# Patient Record
Sex: Female | Born: 1980 | Race: Black or African American | Hispanic: No | Marital: Married | Smoking: Never smoker
Health system: Southern US, Community
[De-identification: ages and names within clinical notes are randomized; demographics above are authoritative.]

## PROBLEM LIST (undated history)

## (undated) DIAGNOSIS — R7303 Prediabetes: Secondary | ICD-10-CM

## (undated) DIAGNOSIS — D250 Submucous leiomyoma of uterus: Secondary | ICD-10-CM

## (undated) DIAGNOSIS — Z87442 Personal history of urinary calculi: Secondary | ICD-10-CM

---

## 2014-05-31 ENCOUNTER — Emergency Department: Payer: Self-pay | Admitting: Emergency Medicine

## 2014-05-31 LAB — URINALYSIS, COMPLETE
BILIRUBIN, UR: NEGATIVE
Bacteria: NONE SEEN
GLUCOSE, UR: NEGATIVE mg/dL (ref 0–75)
Ketone: NEGATIVE
Nitrite: NEGATIVE
PH: 5 (ref 4.5–8.0)
RBC,UR: 10 /HPF (ref 0–5)
SPECIFIC GRAVITY: 1.03 (ref 1.003–1.030)
Squamous Epithelial: 7

## 2014-05-31 LAB — BASIC METABOLIC PANEL
ANION GAP: 7 (ref 7–16)
BUN: 10 mg/dL (ref 7–18)
CREATININE: 1.1 mg/dL (ref 0.60–1.30)
Calcium, Total: 8.2 mg/dL — ABNORMAL LOW (ref 8.5–10.1)
Chloride: 108 mmol/L — ABNORMAL HIGH (ref 98–107)
Co2: 24 mmol/L (ref 21–32)
EGFR (African American): >60
EGFR (Non-African Amer.): >60
Glucose: 94 mg/dL (ref 65–99)
Osmolality: 276 (ref 275–301)
Potassium: 3.5 mmol/L (ref 3.5–5.1)
SODIUM: 139 mmol/L (ref 136–145)

## 2014-05-31 LAB — CBC
HCT: 42 % (ref 35.0–47.0)
HGB: 13.6 g/dL (ref 12.0–16.0)
MCH: 29 pg (ref 26.0–34.0)
MCHC: 32.5 g/dL (ref 32.0–36.0)
MCV: 89 fL (ref 80–100)
PLATELETS: 232 10*3/uL (ref 150–440)
RBC: 4.71 10*6/uL (ref 3.80–5.20)
RDW: 13.6 % (ref 11.5–14.5)
WBC: 8.3 10*3/uL (ref 3.6–11.0)

## 2014-05-31 LAB — TROPONIN I: Troponin-I: <0.02 ng/mL

## 2014-06-03 LAB — URINE CULTURE

## 2014-06-18 ENCOUNTER — Ambulatory Visit: Payer: Self-pay | Admitting: Urology

## 2014-07-04 ENCOUNTER — Ambulatory Visit: Payer: Self-pay | Admitting: Urology

## 2014-07-04 HISTORY — PX: URETEROSCOPY: SHX842

## 2014-12-22 NOTE — Op Note (Signed)
PATIENT NAMEMALENE, Jill Mcmillan MR#:  [id number] DATE OF BIRTH:  August 06, 1981  DATE OF PROCEDURE:  07/04/2014  PREOPERATIVE DIAGNOSIS: Left distal ureteral stone.   POSTOPERATIVE DIAGNOSIS: No stone identified.   PROCEDURE PERFORMED: Left ureteroscopy, left retrograde pyelogram, left ureteral stent placement on string.   ATTENDING SURGEON: Sherlynn Stalls, MD.   ANESTHESIA: General anesthesia.   ESTIMATED BLOOD LOSS: Minimal.   DRAINS: A 6 x 24 French double-J ureteral stent on the left.   COMPLICATIONS: None.   SPECIMENS: None.   INDICATION: This is a 34 year old female with a very small left distal ureteral stone which has been present for many months. She has continued to have left lower quadrant pain. Followup renal ultrasound 2 weeks ago did show some fullness in the left collecting system consistent with retained stone. She was counseled on her various treatment options and has elected to undergo left ureteroscopy today for possible intervention for this stone. Of note in the preoperative holding area she did report that over the past week she has not had any further pain. She was offered to repeat imaging to assess whether the stone has passed versus proceed to the OR for a diagnostic ureteroscopy and elects to go to the operating room today. Risks and benefits of the surgery were explained in detail. The patient agreed to proceed as planned.   PROCEDURE: The patient was correctly identified in the preoperative holding area and informed consent was obtained. She was brought to the operating suite and placed on the table in the supine position. At this time universal timeout protocol was performed. All team members were identified. Venodyne boots were placed and she was administered IV antibiotics in the perioperative period. She was then placed under general anesthesia, repositioned in the bed in the dorsal lithotomy position, and prepped and draped in standard surgical fashion. A rigid  ureteroscope was advanced per urethra into the bladder and the bladder was surveyed. Attention was turned to the left ureteral orifice which was easily cannulated using a 5 [place] open-ended ureteral catheter just within the UO. A retrograde pyelogram was then performed, which revealed a delicate-appearing ureter without evidence of filling defects or hydronephrosis. Her renal pelvis was slightly full, however this was not felt to be related to an obstruction. I did go ahead and attempt left ureteroscopy within the distal ureter to insure that there was indeed no stone fragment. I had some slight difficulty entering the left ureteral orifice and a second wire was introduced up to the level of the proximal ureter and the 7 French short rigid ureteroscope was able to advance within the distal ureter without difficulty. I was able to advance the scope up to the mid distal ureter, however there was some concentric narrowing, I was unable to pass easily beyond this area. In order to avoid any injury to the ureter with a very high suspicion that the stone had already passed, the decision was made not to proceed any further. The safety wire was back-loaded over a rigid cystoscope and a 6 x 24 French double-J ureteral stent was placed over the wire up to the level of the renal pelvis. The wire was partially withdrawn until a coil was noted within the renal pelvis. The wire was then fully withdrawn and a coil was noted within the bladder. The string was left on the stent with plans for the patient to remove the stent herself on Monday, 5 days from today. The patient was reversed from anesthesia and taken  to the PACU in stable condition after the string was secured to the patient's left inner thigh using Mastisol and a Tegaderm. There were no complications in this case.   PLAN: The patient will self remove the stent on Monday as there is likely no residual stone and if there was indeed a 2 mm fragment then this would likely  come out with stent removal. She will follow up in the office in 4 weeks for renal ultrasound to confirm resolution of her fullness.     ____________________________ Sherlynn Stalls, MD ajb:[id number] D: 07/04/2014 19:47:20 ET T: 07/04/2014 20:14:40 ET JOB#: [id number]  cc: Sherlynn Stalls, MD, <Dictator> Sherlynn Stalls MD ELECTRONICALLY SIGNED 07/19/2014 15:19

## 2016-06-08 IMAGING — US US RENAL KIDNEY
1 series · 14 of 25 positions shown · non-contrast
Comparison: No priors

CLINICAL DATA: Flank pain with history of kidney stones.

EXAM:
RENAL/URINARY TRACT ULTRASOUND COMPLETE

[Series 1: us renal kidney · 0.26mm/px · 14 of 52 slices shown]
[im 1/52]
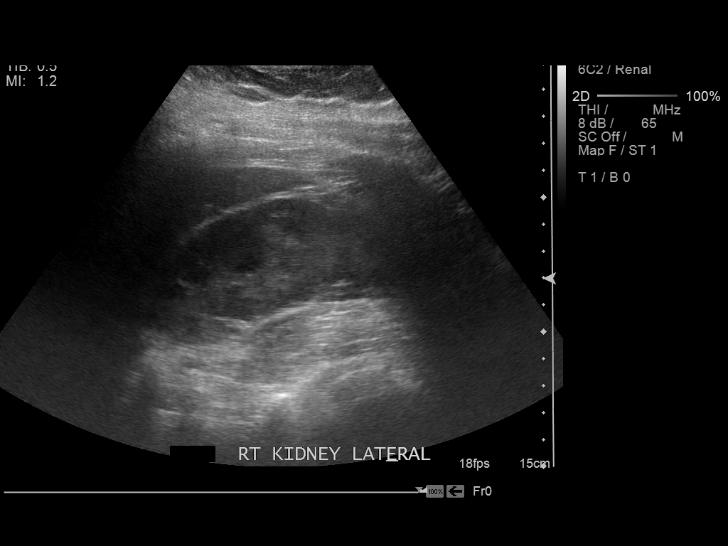
[im 5/52]
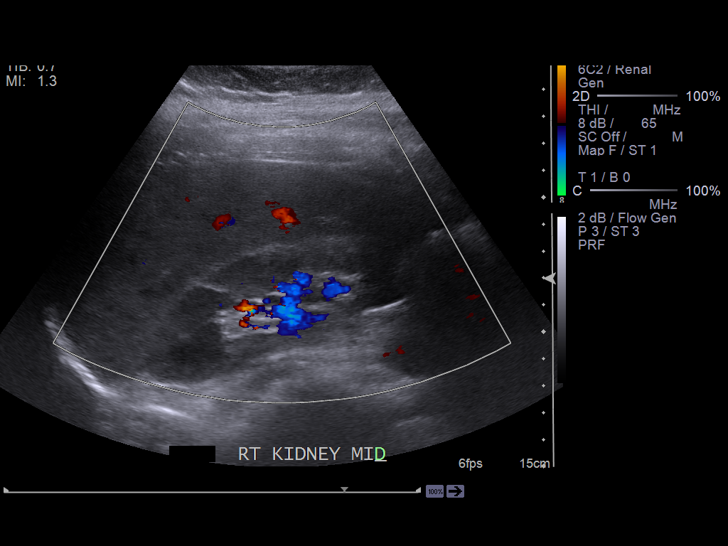
[im 9/52]
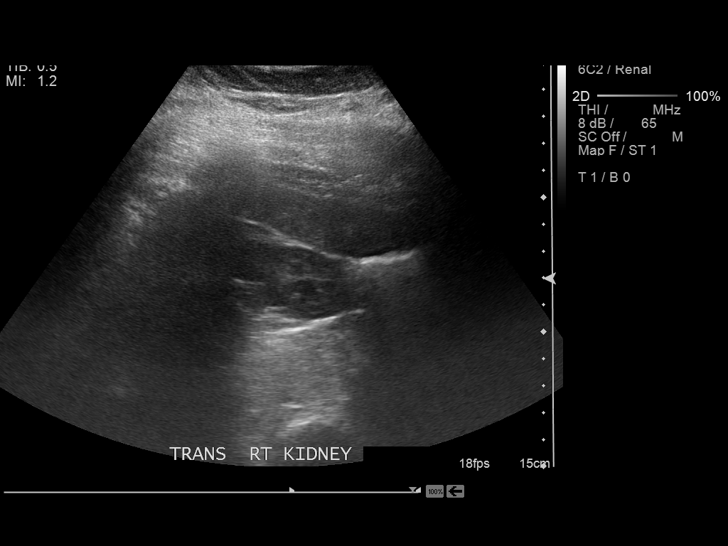
[im 13/52]
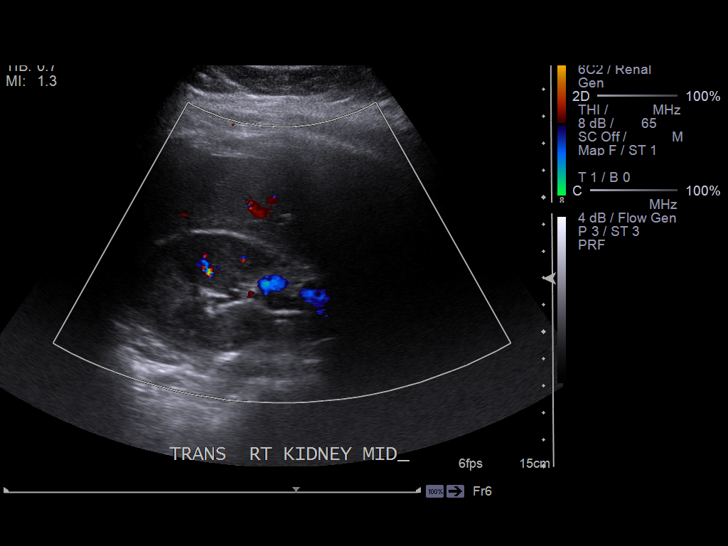
[im 18/52]
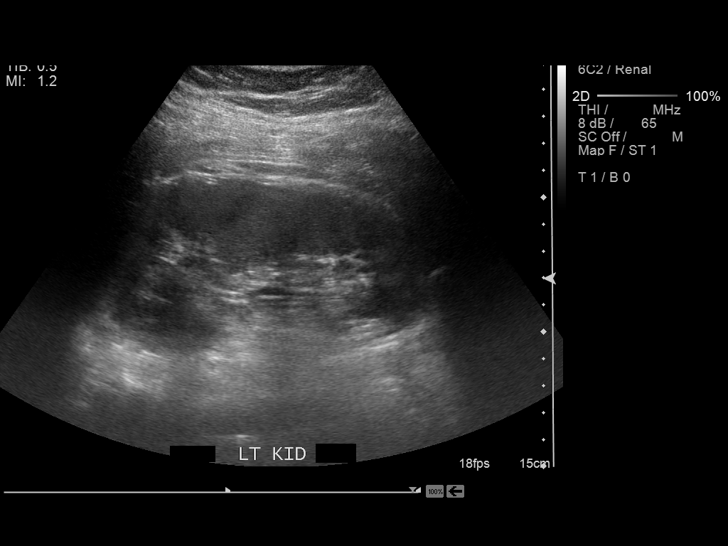
[im 20/52]
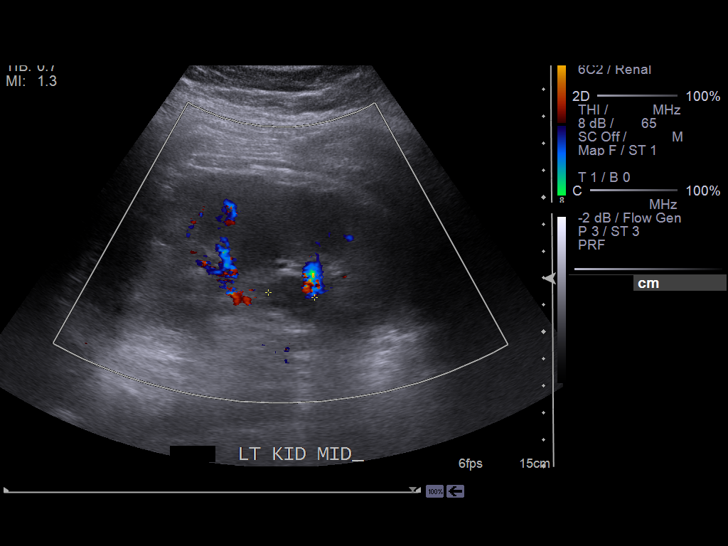
[im 24/52]
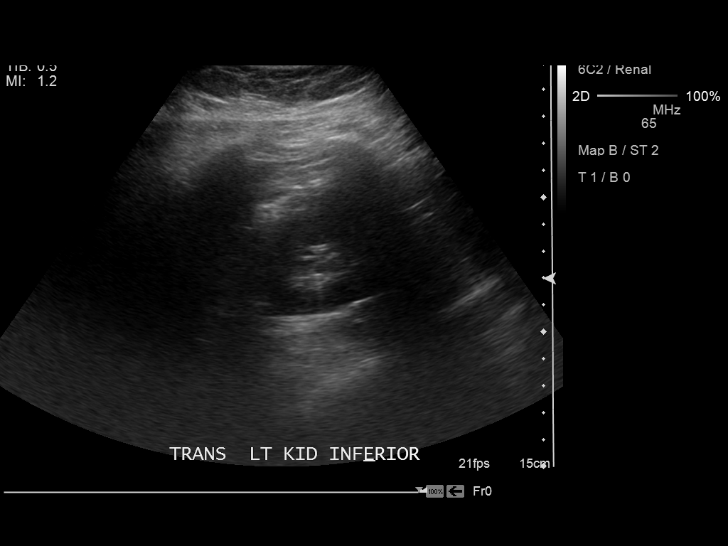
[im 28/52]
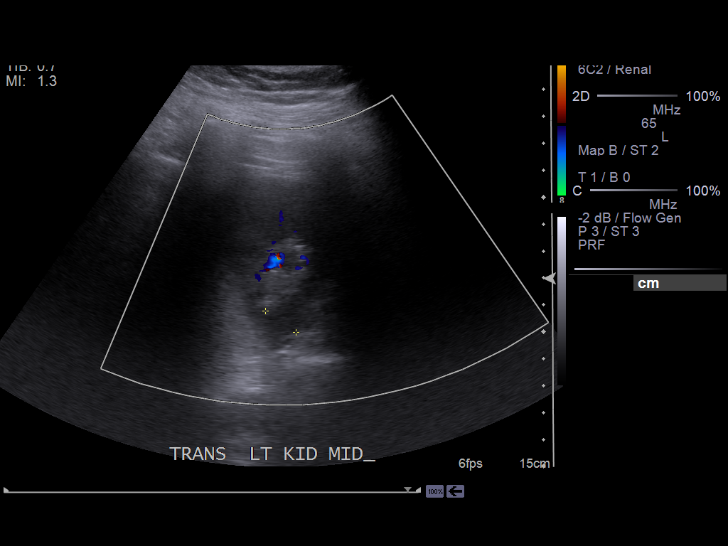
[im 32/52]
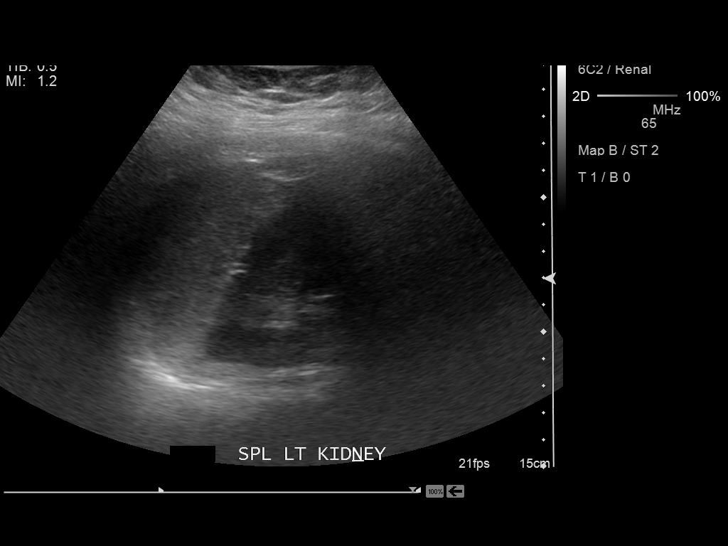
[im 35/52]
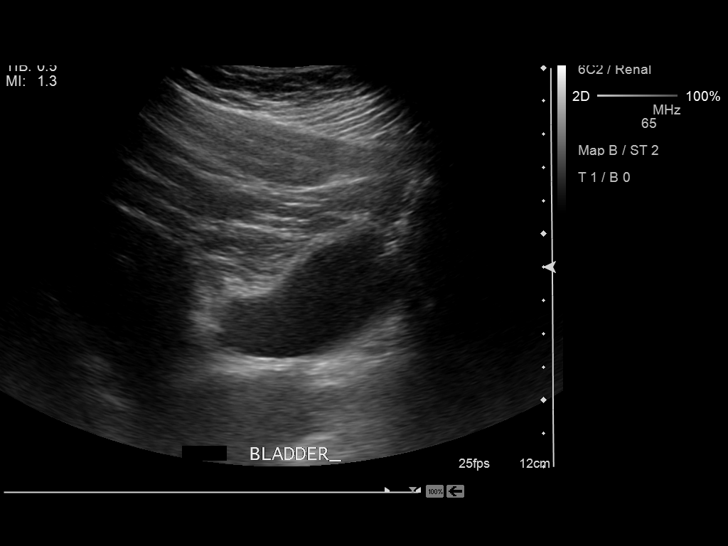
[im 39/52]
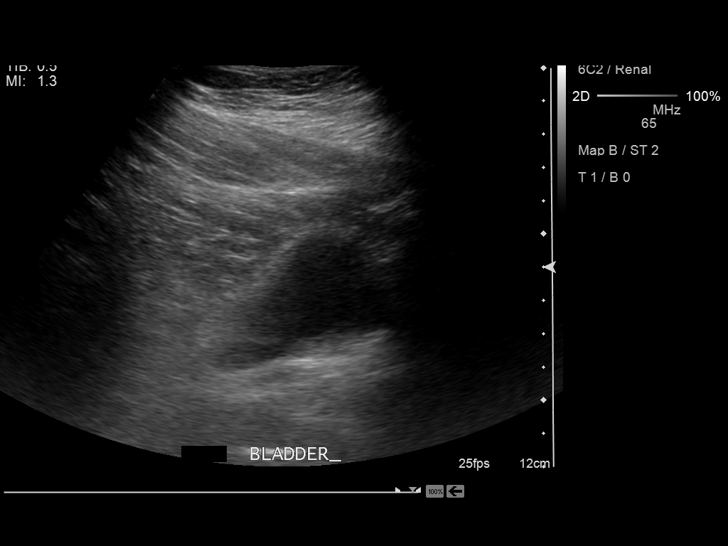
[im 43/52]
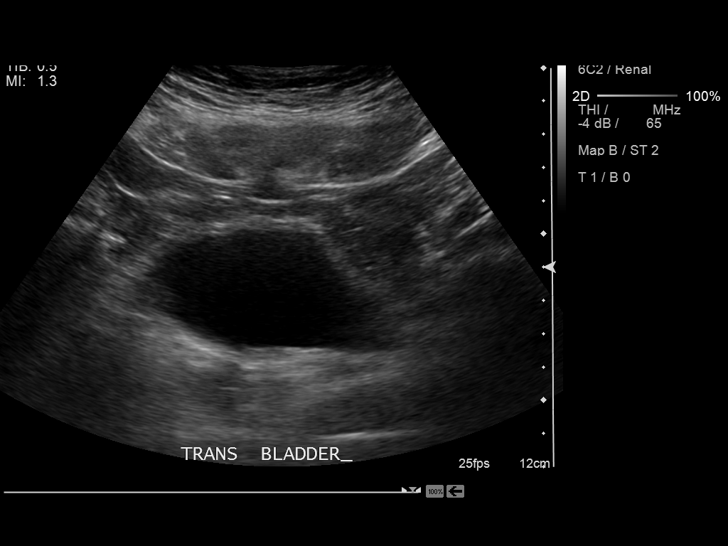
[im 47/52]
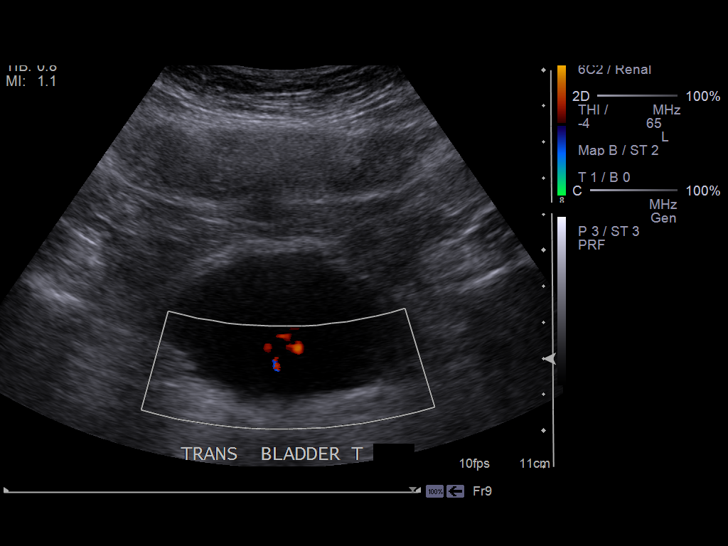
[im 52/52]
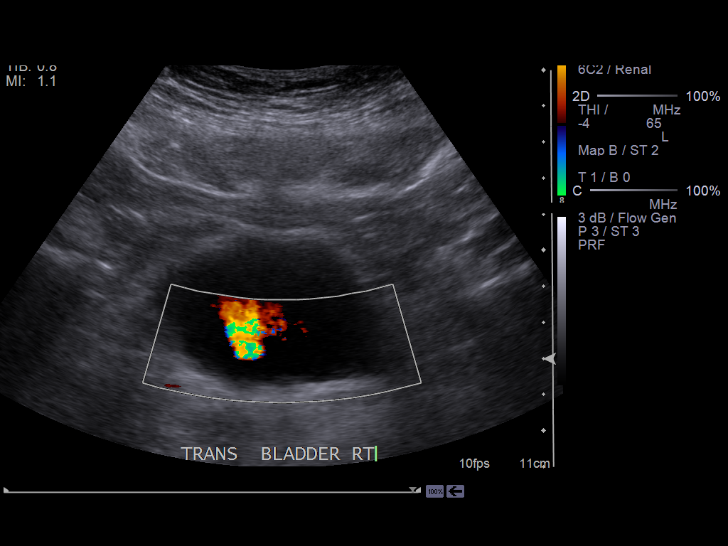

[14 of 25 positions shown; findings below may reference images not displayed]

FINDINGS: Right Kidney:

Length: 10.9 cm. Echogenicity within normal limits. No mass or
hydronephrosis visualized. No stones visualized by ultrasound.

Left Kidney:

Length: 12.1 cm. Echogenicity within normal limits. There is slight
asymmetric fullness of the left renal pelvis compared to the right
renal pelvis. No definite calyceal dilatation is seen in the left
kidney. No stones visualized by ultrasound.

Bladder:

Appears normal for degree of bladder distention.
IMPRESSION: 1. Mild asymmetric fullness of the left renal collecting system
without definitive hydronephrosis.
2. Normal size kidneys bilaterally.
3. No renal stones are visualized. Please note that CT is more
sensitive for detecting urinary tract stones than ultrasound.

## 2020-02-13 ENCOUNTER — Ambulatory Visit: Payer: Self-pay | Attending: Internal Medicine

## 2021-08-20 ENCOUNTER — Other Ambulatory Visit: Payer: Self-pay

## 2021-08-20 ENCOUNTER — Encounter: Payer: Self-pay | Admitting: Obstetrics and Gynecology

## 2021-08-20 ENCOUNTER — Ambulatory Visit: Payer: Managed Care, Other (non HMO) | Admitting: Obstetrics and Gynecology

## 2021-08-20 ENCOUNTER — Other Ambulatory Visit (HOSPITAL_COMMUNITY)
Admission: RE | Admit: 2021-08-20 | Discharge: 2021-08-20 | Disposition: A | Discharge status: Home / Self Care | Payer: Managed Care, Other (non HMO) | Source: Ambulatory Visit | Attending: Obstetrics and Gynecology | Admitting: Obstetrics and Gynecology

## 2021-08-20 VITALS — BP 119/82 | HR 78 | Ht 69.0 in | Wt 267.3 lb

## 2021-08-20 DIAGNOSIS — Z01419 Encounter for gynecological examination (general) (routine) without abnormal findings: Secondary | ICD-10-CM | POA: Insufficient documentation

## 2021-08-20 DIAGNOSIS — N921 Excessive and frequent menstruation with irregular cycle: Secondary | ICD-10-CM | POA: Diagnosis not present

## 2021-08-20 DIAGNOSIS — Z975 Presence of (intrauterine) contraceptive device: Secondary | ICD-10-CM

## 2021-08-20 NOTE — Progress Notes (Signed)
Patient presents for annual exam. Patient states experiencing heavy period that has been going on for 6 months after years of no period while having an IUD. Patient reports the bleeding has gone from a heavy flow to moderate flow for the past 3 months. Patient states a lump in her vagina for 4-5 days causing dryness, patient states bright red blood upon wiping. Patient states lump has reduced in size and no longer hurting.

## 2021-08-20 NOTE — Progress Notes (Signed)
HPI:      [name] is a 40 y.o. G2P1011 who LMP was Patient's last menstrual period was 07/30/2021 (exact date).  Subjective:   She presents today for her annual examination.  She states that approximately 6 months ago she began having menstrual bleeding with her IUD.  Prior to that she had been amenorrheic.  She says her periods are heavy with cramping each month.  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 Secondarily she reports that she developed a small cystic structure on her left labia which was moderately painful but which has now seem to resolve. She is not sure of her last Pap smear and believes she is due for one. She states she is up-to-date on mammograms and these occur for her in March.    Hx: The following portions of the patient's history were reviewed and updated as appropriate:             She  has no past medical history on file. She does not have a problem list on file. She  has no past surgical history on file. Her family history includes Alcohol abuse in her father; Breast cancer in her mother; COPD in her father; Hypertension in her father and mother. She  reports that she has never smoked. She has never used smokeless tobacco. She reports current alcohol use of about 2.0 standard drinks per week. She reports that she does not currently use drugs. She currently has no medications in their medication list. She is allergic to penicillins.       Review of Systems:  Review of Systems  Constitutional: Denied constitutional symptoms, night sweats, recent illness, fatigue, fever, insomnia and weight loss.  Eyes: Denied eye symptoms, eye pain, photophobia, vision change and visual disturbance.  Ears/Nose/Throat/Neck: Denied ear, nose, throat or neck symptoms, hearing loss, nasal discharge, sinus congestion and sore throat.  Cardiovascular: Denied cardiovascular symptoms, arrhythmia, chest pain/pressure, edema, exercise intolerance,  orthopnea and palpitations.  Respiratory: Denied pulmonary symptoms, asthma, pleuritic pain, productive sputum, cough, dyspnea and wheezing.  Gastrointestinal: Denied, gastro-esophageal reflux, melena, nausea and vomiting.  Genitourinary: See HPI for additional information.  Musculoskeletal: Denied musculoskeletal symptoms, stiffness, swelling, muscle weakness and myalgia.  Dermatologic: Denied dermatology symptoms, rash and scar.  Neurologic: Denied neurology symptoms, dizziness, headache, neck pain and syncope.  Psychiatric: Denied psychiatric symptoms, anxiety and depression.  Endocrine: Denied endocrine symptoms including hot flashes and night sweats.   Meds:   No current outpatient medications on file prior to visit.   No current facility-administered medications on file prior to visit.    Objective:     Vitals:   08/20/21 1441  BP: 119/82  Pulse: 78    Filed Weights   08/20/21 1441  Weight: 267 lb 4.8 oz (121.2 kg)              Physical examination General NAD, Conversant  HEENT Atraumatic; Op clear with mmm.  Normo-cephalic. Pupils reactive. Anicteric sclerae  Thyroid/Neck Smooth without nodularity or enlargement. Normal ROM.  Neck Supple.  Skin No rashes, lesions or ulceration. Normal palpated skin turgor. No nodularity.  Breasts: No masses or discharge.  Symmetric.  No axillary adenopathy.  Lungs: Clear to auscultation.No rales or wheezes. Normal Respiratory effort, no retractions.  Heart: NSR.  No murmurs or rubs appreciated. No periferal edema  Abdomen: Soft.  Non-tender.  No masses.  No HSM. No hernia  Extremities: Moves all appropriately.  Normal ROM for age.  No lymphadenopathy.  Neuro: Oriented to PPT.  Normal mood. Normal affect.     Pelvic:   Vulva: Normal appearance.  No lesions.  Vagina: No lesions or abnormalities noted.  Support: Normal pelvic support.  Urethra No masses tenderness or scarring.  Meatus Normal size without lesions or prolapse.   Cervix: Normal appearance.  No lesions.  IUD strings noted.  Longer than expected.  Anus: Normal exam.  No lesions.  Perineum: Normal exam.  No lesions.        Bimanual   Uterus: Normal size.  Non-tender.  Mobile.  AV.  Adnexae: No masses.  Non-tender to palpation.  Cul-de-sac: Negative for abnormality.     Assessment:    G2P1011 There are no problems to display for this patient.    1. Well woman exam with routine gynecological exam   2. Breakthrough bleeding associated with intrauterine device (IUD)     Normal exam.  Possibly malpositioned IUD based on cramping and bleeding starting suddenly and continuing.   Plan:            1.  Basic Screening Recommendations The basic screening recommendations for asymptomatic women were discussed with the patient during her visit.  The age-appropriate recommendations were discussed with her and the rational for the tests reviewed.  When I am informed by the patient that another primary care physician has previously obtained the age-appropriate tests and they are up-to-date, only outstanding tests are ordered and referrals given as necessary.  Abnormal results of tests will be discussed with her when all of her results are completed.  Routine preventative health maintenance measures emphasized: Exercise/Diet/Weight control, Tobacco Warnings, Alcohol/Substance use risks and Stress Management Pap performed mammogram ordered 2.  Ultrasound ordered for IUD position Orders No orders of the defined types were placed in this encounter.   No orders of the defined types were placed in this encounter.         F/U  No follow-ups on file.  Finis Bud, M.D. 08/20/2021 3:02 PM

## 2021-08-27 LAB — CYTOLOGY - PAP
Comment: NEGATIVE
Diagnosis: NEGATIVE
High risk HPV: NEGATIVE

## 2021-09-10 ENCOUNTER — Other Ambulatory Visit: Payer: Managed Care, Other (non HMO)

## 2021-09-17 ENCOUNTER — Other Ambulatory Visit: Payer: Self-pay | Admitting: Obstetrics and Gynecology

## 2021-09-17 ENCOUNTER — Ambulatory Visit (INDEPENDENT_AMBULATORY_CARE_PROVIDER_SITE_OTHER): Payer: Managed Care, Other (non HMO)

## 2021-09-17 ENCOUNTER — Other Ambulatory Visit: Payer: Self-pay

## 2021-09-17 DIAGNOSIS — N921 Excessive and frequent menstruation with irregular cycle: Secondary | ICD-10-CM

## 2021-09-17 DIAGNOSIS — Z975 Presence of (intrauterine) contraceptive device: Secondary | ICD-10-CM

## 2021-09-27 ENCOUNTER — Encounter: Payer: Self-pay | Admitting: Obstetrics and Gynecology

## 2021-10-01 NOTE — Progress Notes (Signed)
Jill Mcmillan: Please have her schedule a video visit with me to discuss her most recent ultrasound findings and to review her most recent history of bleeding.

## 2021-10-07 ENCOUNTER — Encounter: Payer: Self-pay | Admitting: Obstetrics and Gynecology

## 2021-10-07 ENCOUNTER — Telehealth: Payer: Managed Care, Other (non HMO) | Admitting: Obstetrics and Gynecology

## 2021-10-07 DIAGNOSIS — Z975 Presence of (intrauterine) contraceptive device: Secondary | ICD-10-CM | POA: Diagnosis not present

## 2021-10-07 DIAGNOSIS — D250 Submucous leiomyoma of uterus: Secondary | ICD-10-CM | POA: Diagnosis not present

## 2021-10-07 DIAGNOSIS — N921 Excessive and frequent menstruation with irregular cycle: Secondary | ICD-10-CM | POA: Diagnosis not present

## 2021-10-07 NOTE — Progress Notes (Signed)
Virtual Visit via Video Note  I connected with Jill Mcmillan on 10/07/21 at  7:45 AM EST by video and verified that I was speaking with the correct person using two identifiers.    [name] is a 41 y.o. G2P1011 who LMP was No LMP recorded. I discussed the limitations, risks, security and privacy concerns of performing an evaluation and management service by video and the availability of in person appointments. I also discussed with the patient that there may be a patient responsible charge related to this service. The patient expressed understanding and agreed to proceed.  Location of patient:  Home  Patient gave explicit verbal consent for video visit:  YES  Location of provider:  [facility] office  Persons other than physician and patient involved in provider conference:  None   Subjective:   History of Present Illness:    Irregular bleeding associated with IUD.  Patient continues to have bleeding approximately every 2 weeks and is not just spotting.  She is understandably frustrated with the bleeding. She recently underwent ultrasound for positioning of the IUD or to determine any other causes of bleeding.  Hx: The following portions of the patient's history were reviewed and updated as appropriate:             She  has no past medical history on file. She does not have a problem list on file. She  has no past surgical history on file. Her family history includes Alcohol abuse in her father; Breast cancer in her mother; COPD in her father; Hypertension in her father and mother. She  reports that she has never smoked. She has never used smokeless tobacco. She reports current alcohol use of about 2.0 standard drinks per week. She reports that she does not currently use drugs. She currently has no medications in their medication list. She is allergic to penicillins.       Review of Systems:  Review of Systems  Constitutional: Denied constitutional symptoms, night sweats, recent  illness, fatigue, fever, insomnia and weight loss.  Eyes: Denied eye symptoms, eye pain, photophobia, vision change and visual disturbance.  Ears/Nose/Throat/Neck: Denied ear, nose, throat or neck symptoms, hearing loss, nasal discharge, sinus congestion and sore throat.  Cardiovascular: Denied cardiovascular symptoms, arrhythmia, chest pain/pressure, edema, exercise intolerance, orthopnea and palpitations.  Respiratory: Denied pulmonary symptoms, asthma, pleuritic pain, productive sputum, cough, dyspnea and wheezing.  Gastrointestinal: Denied, gastro-esophageal reflux, melena, nausea and vomiting.  Genitourinary: See HPI for additional information.  Musculoskeletal: Denied musculoskeletal symptoms, stiffness, swelling, muscle weakness and myalgia.  Dermatologic: Denied dermatology symptoms, rash and scar.  Neurologic: Denied neurology symptoms, dizziness, headache, neck pain and syncope.  Psychiatric: Denied psychiatric symptoms, anxiety and depression.  Endocrine: Denied endocrine symptoms including hot flashes and night sweats.   Meds:   No current outpatient medications on file prior to visit.   No current facility-administered medications on file prior to visit.    Ultrasound:  Findings:  The uterus is anteverted and measures 9.6 x 6.2 x 5.6 cm. Echo texture is heterogenous with evidence of focal masses. Within the uterus are multiple suspected fibroids measuring: Fibroid 1:  3.3 x 3.1 x 4.0 cm subserosal  Fibroid 2:   1.6 x 1.8 x 1.9 cm intramural Fibroid 3:   1.5 x 1.3 x 1.7 cm Fundal subserosal Fibroid 4:    1.4 x .9 x 1.0 cm Submucosal   The Endometrium measures 13.9 mm.   Right Ovary measures 1.8 x 4.0 x 3.2 cm.  It is normal in appearance; seen transabdominally only.   Left Ovary is not visualized.   Survey of the adnexa demonstrates no adnexal masses. There is no free fluid in the cul de sac.   Impression: 1. Fibroid Uterus. 2. IUD in normal location; submucosal  fibroid may be impinging.  Assessment:    G2P1011 There are no problems to display for this patient.    1. Fibroids, submucosal   2. Breakthrough bleeding associated with intrauterine device (IUD)     Fibroids especially the submucosal fibroid is likely causing her irregular bleeding.  This in association with the IUD which may not be helping but actually increasing the bleeding as it is located immediately adjacent to the fibroid.  Plan:            1.  We have discussed multiple management schemes for uterine fibroids and irregular bleeding.  Her bleeding is not tolerable and thus a solution is sought.  She will need her IUD removed and then her options include hormonal control, endometrial ablation, uterine fibroid embolization, ultrasound-guided focused fibroid treatment.  Finally, we have also discussed the option of hysterectomy for definitive management. Fibroids in general have been discussed including subserosal submucosal and intramural.  We discussed the natural course and history of fibroids including their usual resolution of symptoms at menopause.  All questions were answered. She will inform us when she has reached a decision and we will head in that direction. Orders No orders of the defined types were placed in this encounter.   No orders of the defined types were placed in this encounter.     F/U  No follow-ups on file. I spent 22 minutes involved in the care of this patient preparing to see the patient by obtaining and reviewing her medical history (including labs, imaging tests and prior procedures), documenting clinical information in the electronic health record (EHR), counseling and coordinating care plans, writing and sending prescriptions, ordering tests or procedures and in direct communicating with the patient and medical staff discussing pertinent items from her history and physical exam.   Finis Bud, M.D. 10/07/2021 8:06 AM

## 2021-10-23 ENCOUNTER — Ambulatory Visit (INDEPENDENT_AMBULATORY_CARE_PROVIDER_SITE_OTHER): Payer: Managed Care, Other (non HMO) | Admitting: Obstetrics and Gynecology

## 2021-10-23 ENCOUNTER — Other Ambulatory Visit: Payer: Self-pay

## 2021-10-23 ENCOUNTER — Encounter: Payer: Self-pay | Admitting: Obstetrics and Gynecology

## 2021-10-23 VITALS — BP 142/93 | HR 72 | Ht 68.0 in | Wt 269.4 lb

## 2021-10-23 DIAGNOSIS — N921 Excessive and frequent menstruation with irregular cycle: Secondary | ICD-10-CM | POA: Diagnosis not present

## 2021-10-23 DIAGNOSIS — Z01818 Encounter for other preprocedural examination: Secondary | ICD-10-CM

## 2021-10-23 DIAGNOSIS — D219 Benign neoplasm of connective and other soft tissue, unspecified: Secondary | ICD-10-CM

## 2021-10-23 MED ORDER — NORETHINDRONE ACETATE 5 MG PO TABS: 5.0000 mg | ORAL_TABLET | Freq: Every day | ORAL | 0 refills | Status: DC

## 2021-10-23 NOTE — Progress Notes (Signed)
HPI:      [name] is a 41 y.o. G2P1011 who LMP was Patient's last menstrual period was 10/17/2021 (exact date).  Subjective:   She presents today stating that she has considered her options and has come to the conclusion that she would like a hysterectomy performed.  She has experience several episodes of irregular bleeding over the last few months some of this bleeding is heavy lasting 5 days.  She does have an IUD in place and has confirmed intrauterine fibroids 1 of which is submucosal. She has chosen a date in April for her surgery.    Hx: The following portions of the patient's history were reviewed and updated as appropriate:             She  has no past medical history on file. She does not have a problem list on file. She  has no past surgical history on file. Her family history includes Alcohol abuse in her father; Breast cancer in her mother; COPD in her father; Hypertension in her father and mother. She  reports that she has never smoked. She has never used smokeless tobacco. She reports current alcohol use of about 2.0 standard drinks per week. She reports that she does not currently use drugs. She has a current medication list which includes the following prescription(s): cetirizine hcl and norethindrone. She is allergic to penicillins.       Review of Systems:  Review of Systems  Constitutional: Denied constitutional symptoms, night sweats, recent illness, fatigue, fever, insomnia and weight loss.  Eyes: Denied eye symptoms, eye pain, photophobia, vision change and visual disturbance.  Ears/Nose/Throat/Neck: Denied ear, nose, throat or neck symptoms, hearing loss, nasal discharge, sinus congestion and sore throat.  Cardiovascular: Denied cardiovascular symptoms, arrhythmia, chest pain/pressure, edema, exercise intolerance, orthopnea and palpitations.  Respiratory: Denied pulmonary symptoms, asthma, pleuritic pain, productive sputum, cough, dyspnea and wheezing.   Gastrointestinal: Denied, gastro-esophageal reflux, melena, nausea and vomiting.  Genitourinary: See HPI for additional information.  Musculoskeletal: Denied musculoskeletal symptoms, stiffness, swelling, muscle weakness and myalgia.  Dermatologic: Denied dermatology symptoms, rash and scar.  Neurologic: Denied neurology symptoms, dizziness, headache, neck pain and syncope.  Psychiatric: Denied psychiatric symptoms, anxiety and depression.  Endocrine: Denied endocrine symptoms including hot flashes and night sweats.   Meds:   Current Outpatient Medications on File Prior to Visit  Medication Sig Dispense Refill   Cetirizine HCl (ZYRTEC PO) Take by mouth.     No current facility-administered medications on file prior to visit.      Objective:     Vitals:   10/23/21 1445  BP: (!) 142/93  Pulse: 72   Filed Weights   10/23/21 1445  Weight: 269 lb 6.4 oz (122.2 kg)                        Assessment:    G2P1011 There are no problems to display for this patient.    1. Menorrhagia with irregular cycle   2. Fibroids        Plan:            1.  Patient would like to schedule preop appointment for surgery in April.  We have spent some time today discussing her fibroids bleeding and anything that she can do to try to control this bleeding prior to her surgery date.  I have explained to her that I will try progesterone daily to decrease her bleeding episodes and amount.  We will  start Aygestin as prescribed below. No orders of the defined types were placed in this encounter.    Meds ordered this encounter  Medications   norethindrone (AYGESTIN) 5 MG tablet    Sig: Take 1 tablet (5 mg total) by mouth daily. As directed    Dispense:  45 tablet    Refill:  0      F/U  Return in 1 week (on 10/30/2021). I spent 21 minutes involved in the care of this patient preparing to see the patient by obtaining and reviewing her medical history (including labs, imaging tests and  prior procedures), documenting clinical information in the electronic health record (EHR), counseling and coordinating care plans, writing and sending prescriptions, ordering tests or procedures and in direct communicating with the patient and medical staff discussing pertinent items from her history and physical exam.  Finis Bud, M.D. 10/23/2021 4:05 PM

## 2021-10-23 NOTE — Progress Notes (Signed)
Patient presents today for pre-op exam for a hysterectomy. Patient reports heavy periods have led her to this decision. She states no questions or concerns at this time.

## 2021-11-06 ENCOUNTER — Other Ambulatory Visit: Payer: Self-pay

## 2021-11-06 ENCOUNTER — Ambulatory Visit (INDEPENDENT_AMBULATORY_CARE_PROVIDER_SITE_OTHER): Payer: Managed Care, Other (non HMO) | Admitting: Obstetrics and Gynecology

## 2021-11-06 ENCOUNTER — Encounter: Payer: Self-pay | Admitting: Obstetrics and Gynecology

## 2021-11-06 VITALS — BP 145/92 | HR 75 | Ht 68.0 in | Wt 270.2 lb

## 2021-11-06 DIAGNOSIS — D250 Submucous leiomyoma of uterus: Secondary | ICD-10-CM

## 2021-11-06 DIAGNOSIS — N921 Excessive and frequent menstruation with irregular cycle: Secondary | ICD-10-CM

## 2021-11-06 DIAGNOSIS — Z01818 Encounter for other preprocedural examination: Secondary | ICD-10-CM

## 2021-11-06 DIAGNOSIS — Z975 Presence of (intrauterine) contraceptive device: Secondary | ICD-10-CM

## 2021-11-06 NOTE — Progress Notes (Signed)
Patient presents today for a pre-op exam and discussion. She states no other questions or concerns. ?

## 2021-11-07 MED ORDER — METRONIDAZOLE 500 MG PO TABS: 500.0000 mg | ORAL_TABLET | Freq: Two times a day (BID) | ORAL | 0 refills | Status: DC

## 2021-11-07 NOTE — Progress Notes (Signed)
? ?    PRE-OPERATIVE HISTORY AND PHYSICAL EXAM ? ?PCP:  Sue Lush, PA-C ?Subjective:  ? ?HPI:  Jill Mcmillan is a 41 y.o. G2P1011.  Patient's last menstrual period was 10/17/2021 (exact date).  She presents today for a pre-op discussion and PE. ? ?She has the following symptoms: Menorrhagia, uterine fibroids (submucosal) -failed IUD ? ?Review of Systems:  ? ?Constitutional: Denied constitutional symptoms, night sweats, recent illness, fatigue, fever, insomnia and weight loss.  ?Eyes: Denied eye symptoms, eye pain, photophobia, vision change and visual disturbance.  ?Ears/Nose/Throat/Neck: Denied ear, nose, throat or neck symptoms, hearing loss, nasal discharge, sinus congestion and sore throat.  ?Cardiovascular: Denied cardiovascular symptoms, arrhythmia, chest pain/pressure, edema, exercise intolerance, orthopnea and palpitations.  ?Respiratory: Denied pulmonary symptoms, asthma, pleuritic pain, productive sputum, cough, dyspnea and wheezing.  ?Gastrointestinal: Denied, gastro-esophageal reflux, melena, nausea and vomiting.  ?Genitourinary: See HPI for additional information.  ?Musculoskeletal: Denied musculoskeletal symptoms, stiffness, swelling, muscle weakness and myalgia.  ?Dermatologic: Denied dermatology symptoms, rash and scar.  ?Neurologic: Denied neurology symptoms, dizziness, headache, neck pain and syncope.  ?Psychiatric: Denied psychiatric symptoms, anxiety and depression.  ?Endocrine: Denied endocrine symptoms including hot flashes and night sweats.  ? ?OB History  ?Gravida Para Term Preterm AB Living  ?'2 1 1   1 1  '$ ?SAB IAB Ectopic Multiple Live Births  ?  1     1  ?  ?# Outcome Date GA Lbr Len/2nd Weight Sex Delivery Anes PTL Lv  ?2 Term 2003   5 lb (2.268 kg)  Vag-Spont   LIV  ?1 IAB 2001          ? ? ?History reviewed. No pertinent past medical history. ? ?History reviewed. No pertinent surgical history.   ? ?SOCIAL HISTORY: ? ?Social History  ? ?Tobacco Use  ?Smoking Status Never  ?Smokeless  Tobacco Never  ? ?Social History  ? ?Substance and Sexual Activity  ?Alcohol Use Yes  ? Alcohol/week: 2.0 standard drinks  ? Types: 2 Glasses of wine per week  ? Comment: occasionally  ? ? ?Social History  ? ?Substance and Sexual Activity  ?Drug Use Not Currently  ? ? ?Family History  ?Problem Relation Age of Onset  ? Breast cancer Mother   ? Hypertension Mother   ? Hypertension Father   ? COPD Father   ? Alcohol abuse Father   ? ? ?ALLERGIES:  Penicillins ? ?MEDS: ?  ?Current Outpatient Medications on File Prior to Visit  ?Medication Sig Dispense Refill  ? Cetirizine HCl (ZYRTEC PO) Take by mouth.    ? norethindrone (AYGESTIN) 5 MG tablet Take 1 tablet (5 mg total) by mouth daily. As directed 45 tablet 0  ? ?No current facility-administered medications on file prior to visit.  ? ? ?No orders of the defined types were placed in this encounter. ?  ? ?Physical examination ?BP (!) 145/92   Pulse 75   Ht '5\' 8"'$  (1.727 m)   Wt 270 lb 3.2 oz (122.6 kg)   LMP 10/17/2021 (Exact Date)   BMI 41.08 kg/m?  ? ?General NAD, Conversant  ?HEENT Atraumatic; Op clear with mmm.  Normo-cephalic. Pupils reactive. Anicteric sclerae  ?Thyroid/Neck Smooth without nodularity or enlargement. Normal ROM.  Neck Supple.  ?Skin No rashes, lesions or ulceration. Normal palpated skin turgor. No nodularity.  ?Breasts: No masses or discharge.  Symmetric.  No axillary adenopathy.  ?Lungs: Clear to auscultation.No rales or wheezes. Normal Respiratory effort, no retractions.  ?Heart: NSR.  No murmurs or  rubs appreciated. No periferal edema  ?Abdomen: Soft.  Non-tender.  No masses.  No HSM. No hernia  ?Extremities: Moves all appropriately.  Normal ROM for age. No lymphadenopathy.  ?Neuro: Oriented to PPT.  Normal mood. Normal affect.  ? ?  Pelvic:   ?Vulva: Normal appearance.  No lesions.  ?Vagina: No lesions or abnormalities noted.  ?Support: Normal pelvic support.  ?Urethra No masses tenderness or scarring.  ?Meatus Normal size without lesions or  prolapse.  ?Cervix: Normal ectropion.  No lesions.  ?Anus: Normal exam.  No lesions.  ?Perineum: Normal exam.  No lesions.  ?      Bimanual   ?Uterus: Normal size.  Non-tender.  Mobile.  AV.  ?Adnexae: No masses.  Non-tender to palpation.  ?Cul-de-sac: Negative for abnormality.  ?   From US Findings:  ?The uterus is anteverted and measures 9.6 x 6.2 x 5.6 cm. ?Echo texture is heterogenous with evidence of focal masses. ?Within the uterus are multiple suspected fibroids measuring: ?Fibroid 1:  3.3 x 3.1 x 4.0 cm subserosal  ?Fibroid 2:   1.6 x 1.8 x 1.9 cm intramural ?Fibroid 3:   1.5 x 1.3 x 1.7 cm Fundal subserosal ?Fibroid 4:    1.4 x .9 x 1.0 cm Submucosal ? ?  ?Assessment:  ? ?G2P1011 ?There are no problems to display for this patient. ? ? ?1. Pre-op exam   ?2. Menorrhagia with irregular cycle   ?3. Fibroids, submucosal   ?4. Breakthrough bleeding associated with intrauterine device (IUD)   ?  ?  ? ?Plan:  ? ?Orders: ?No orders of the defined types were placed in this encounter. ?  ? ?1.  LAVH wit bilat sapingetcomy ? ?Pre-op discussions regarding Risks and Benefits of her scheduled surgery. ? ?LAVH ?The procedure of Laparoscopic Assisted Vaginal Hysterectomy was described to the patient in detail.  We reviewed the rationale for Hysterectomy and the patient was again informed of other nonsurgical management possibilities for her condition.  She has considered these other options, and desires a Hysterectomy.  We have reviewed the fact that Hysterectomy is permanent and that following the procedure she will not be able to become pregnant or bear children.  We have discussed the following risk factors specifically and the patient has also been informed that additional complications not mentioned may develop:  Damage to bowel, bladder, ureters or to other internal organs, bleeding, infection and the risk from anesthesia.  We have discussed the procedure itself in detail and she has an informed understanding of this  surgery.  We have also discussed the recovery period in which physical and sexual activity will be restricted for a varying degree of time, often 3 - 6 weeks. ?The Laparoscopic Portion of Hysterectomy has also been reviewed with the patient.  She understands how the laparoscope facilitates the procedure.  We have discussed the abdominal incisions and punctures that will be used.  We have also reviewed the increased Operating Room time often accompanying LAVH.  The slightly increased risk of complications secondary to abdominal punctures, and use of laparoscopic instrumentation has also been discussed in detail.I have answered all of her questions and I believe the patient has an informed understanding of the procedure of Laparoscopic Assisted Vaginal Hysterectomy. ?We have specifically discussed increased risks of fibroids.  We have specifically discussed removal of fallopian tubes.  All questions were answered. ? ? ?I spent 38 minutes involved in the care of this patient preparing to see the patient by obtaining and reviewing  her medical history (including labs, imaging tests and prior procedures), documenting clinical information in the electronic health record (EHR), counseling and coordinating care plans, writing and sending prescriptions, ordering tests or procedures and in direct communicating with the patient and medical staff discussing pertinent items from her history and physical exam. ? ?Finis Bud, M.D. ?11/07/2021 ?11:02 AM ? ? ?

## 2021-11-07 NOTE — H&P (Signed)
? ?    PRE-OPERATIVE HISTORY AND PHYSICAL EXAM ? ?PCP:  Sue Lush, PA-C ?Subjective:  ? ?HPI:  Jill Mcmillan is a 41 y.o. G2P1011.  Patient's last menstrual period was 10/17/2021 (exact date).  She presents today for a pre-op discussion and PE. ? ?She has the following symptoms: Menorrhagia, uterine fibroids (submucosal) -failed IUD ? ?Review of Systems:  ? ?Constitutional: Denied constitutional symptoms, night sweats, recent illness, fatigue, fever, insomnia and weight loss.  ?Eyes: Denied eye symptoms, eye pain, photophobia, vision change and visual disturbance.  ?Ears/Nose/Throat/Neck: Denied ear, nose, throat or neck symptoms, hearing loss, nasal discharge, sinus congestion and sore throat.  ?Cardiovascular: Denied cardiovascular symptoms, arrhythmia, chest pain/pressure, edema, exercise intolerance, orthopnea and palpitations.  ?Respiratory: Denied pulmonary symptoms, asthma, pleuritic pain, productive sputum, cough, dyspnea and wheezing.  ?Gastrointestinal: Denied, gastro-esophageal reflux, melena, nausea and vomiting.  ?Genitourinary: See HPI for additional information.  ?Musculoskeletal: Denied musculoskeletal symptoms, stiffness, swelling, muscle weakness and myalgia.  ?Dermatologic: Denied dermatology symptoms, rash and scar.  ?Neurologic: Denied neurology symptoms, dizziness, headache, neck pain and syncope.  ?Psychiatric: Denied psychiatric symptoms, anxiety and depression.  ?Endocrine: Denied endocrine symptoms including hot flashes and night sweats.  ? ?OB History  ?Gravida Para Term Preterm AB Living  ?'2 1 1   1 1  '$ ?SAB IAB Ectopic Multiple Live Births  ?  1     1  ?  ?# Outcome Date GA Lbr Len/2nd Weight Sex Delivery Anes PTL Lv  ?2 Term 2003   5 lb (2.268 kg)  Vag-Spont   LIV  ?1 IAB 2001          ? ? ?History reviewed. No pertinent past medical history. ? ?History reviewed. No pertinent surgical history.   ? ?SOCIAL HISTORY: ? ?Social History  ? ?Tobacco Use  ?Smoking Status Never  ?Smokeless  Tobacco Never  ? ?Social History  ? ?Substance and Sexual Activity  ?Alcohol Use Yes  ? Alcohol/week: 2.0 standard drinks  ? Types: 2 Glasses of wine per week  ? Comment: occasionally  ? ? ?Social History  ? ?Substance and Sexual Activity  ?Drug Use Not Currently  ? ? ?Family History  ?Problem Relation Age of Onset  ? Breast cancer Mother   ? Hypertension Mother   ? Hypertension Father   ? COPD Father   ? Alcohol abuse Father   ? ? ?ALLERGIES:  Penicillins ? ?MEDS: ?  ?Current Outpatient Medications on File Prior to Visit  ?Medication Sig Dispense Refill  ? Cetirizine HCl (ZYRTEC PO) Take by mouth.    ? norethindrone (AYGESTIN) 5 MG tablet Take 1 tablet (5 mg total) by mouth daily. As directed 45 tablet 0  ? ?No current facility-administered medications on file prior to visit.  ? ? ?No orders of the defined types were placed in this encounter. ?  ? ?Physical examination ?BP (!) 145/92   Pulse 75   Ht '5\' 8"'$  (1.727 m)   Wt 270 lb 3.2 oz (122.6 kg)   LMP 10/17/2021 (Exact Date)   BMI 41.08 kg/m?  ? ?General NAD, Conversant  ?HEENT Atraumatic; Op clear with mmm.  Normo-cephalic. Pupils reactive. Anicteric sclerae  ?Thyroid/Neck Smooth without nodularity or enlargement. Normal ROM.  Neck Supple.  ?Skin No rashes, lesions or ulceration. Normal palpated skin turgor. No nodularity.  ?Breasts: No masses or discharge.  Symmetric.  No axillary adenopathy.  ?Lungs: Clear to auscultation.No rales or wheezes. Normal Respiratory effort, no retractions.  ?Heart: NSR.  No murmurs or  rubs appreciated. No periferal edema  ?Abdomen: Soft.  Non-tender.  No masses.  No HSM. No hernia  ?Extremities: Moves all appropriately.  Normal ROM for age. No lymphadenopathy.  ?Neuro: Oriented to PPT.  Normal mood. Normal affect.  ? ?  Pelvic:   ?Vulva: Normal appearance.  No lesions.  ?Vagina: No lesions or abnormalities noted.  ?Support: Normal pelvic support.  ?Urethra No masses tenderness or scarring.  ?Meatus Normal size without lesions or  prolapse.  ?Cervix: Normal ectropion.  No lesions.  ?Anus: Normal exam.  No lesions.  ?Perineum: Normal exam.  No lesions.  ?      Bimanual   ?Uterus: Normal size.  Non-tender.  Mobile.  AV.  ?Adnexae: No masses.  Non-tender to palpation.  ?Cul-de-sac: Negative for abnormality.  ?   From US Findings:  ?The uterus is anteverted and measures 9.6 x 6.2 x 5.6 cm. ?Echo texture is heterogenous with evidence of focal masses. ?Within the uterus are multiple suspected fibroids measuring: ?Fibroid 1:  3.3 x 3.1 x 4.0 cm subserosal  ?Fibroid 2:   1.6 x 1.8 x 1.9 cm intramural ?Fibroid 3:   1.5 x 1.3 x 1.7 cm Fundal subserosal ?Fibroid 4:    1.4 x .9 x 1.0 cm Submucosal ? ?  ?Assessment:  ? ?G2P1011 ?There are no problems to display for this patient. ? ? ?1. Pre-op exam   ?2. Menorrhagia with irregular cycle   ?3. Fibroids, submucosal   ?4. Breakthrough bleeding associated with intrauterine device (IUD)   ?  ?  ? ?Plan:  ? ?Orders: ?No orders of the defined types were placed in this encounter. ?  ? ?1.  LAVH wit bilat sapingetcomy ? ?

## 2021-11-30 ENCOUNTER — Other Ambulatory Visit: Payer: Self-pay | Admitting: Obstetrics and Gynecology

## 2021-11-30 DIAGNOSIS — N921 Excessive and frequent menstruation with irregular cycle: Secondary | ICD-10-CM

## 2021-11-30 DIAGNOSIS — D219 Benign neoplasm of connective and other soft tissue, unspecified: Secondary | ICD-10-CM

## 2021-12-03 ENCOUNTER — Other Ambulatory Visit: Payer: Self-pay | Admitting: Obstetrics and Gynecology

## 2021-12-03 DIAGNOSIS — N921 Excessive and frequent menstruation with irregular cycle: Secondary | ICD-10-CM

## 2021-12-03 DIAGNOSIS — D219 Benign neoplasm of connective and other soft tissue, unspecified: Secondary | ICD-10-CM

## 2021-12-03 MED ORDER — NORETHINDRONE ACETATE 5 MG PO TABS: 5.0000 mg | ORAL_TABLET | Freq: Every day | ORAL | 0 refills | Status: DC

## 2021-12-15 ENCOUNTER — Encounter
Admission: RE | Admit: 2021-12-15 | Discharge: 2021-12-15 | Disposition: A | Discharge status: Home / Self Care | Payer: Managed Care, Other (non HMO) | Source: Ambulatory Visit | Attending: Obstetrics and Gynecology | Admitting: Obstetrics and Gynecology

## 2021-12-15 HISTORY — DX: Prediabetes: R73.03

## 2021-12-15 HISTORY — DX: Submucous leiomyoma of uterus: D25.0

## 2021-12-15 HISTORY — DX: Personal history of urinary calculi: Z87.442

## 2021-12-15 NOTE — Patient Instructions (Addendum)
Your procedure is scheduled on: Monday, April 24 ?Report to the Registration Desk on the 1st floor of the [facility]. ?To find out your arrival time, please call [phone] between 1PM - 3PM on: Friday, April 21 ? ?REMEMBER: ?Instructions that are not followed completely may result in serious medical risk, up to and including death; or upon the discretion of your surgeon and anesthesiologist your surgery may need to be rescheduled. ? ?Do not eat food after midnight the night before surgery.  ?No gum chewing, lozengers or hard candies. ? ?You may however, drink CLEAR liquids up to 2 hours before you are scheduled to arrive for your surgery. Do not drink anything within 2 hours of your scheduled arrival time. ? ?Clear liquids include: ?- water  ?- apple juice without pulp ?- gatorade (not RED colors) ?- black coffee or tea (Do NOT add milk or creamers to the coffee or tea) ?Do NOT drink anything that is not on this list. ? ?DO NOT TAKE ANY MEDICATIONS THE MORNING OF SURGERY  ? ?One week prior to surgery: STARTING April 17 ?Stop Anti-inflammatories (NSAIDS) such as Advil, Aleve, Ibuprofen, Motrin, Naproxen, Naprosyn and Aspirin based products such as Excedrin, Goodys Powder, BC Powder. ?Stop ANY OVER THE COUNTER supplements until after surgery. ?You may however, continue to take Tylenol if needed for pain up until the day of surgery. ? ?No Alcohol for 24 hours before or after surgery. ? ?No Smoking including e-cigarettes for 24 hours prior to surgery.  ?No chewable tobacco products for at least 6 hours prior to surgery.  ?No nicotine patches on the day of surgery. ? ?Do not use any "recreational" drugs for at least a week prior to your surgery.  ?Please be advised that the combination of cocaine and anesthesia may have negative outcomes, up to and including death. ?If you test positive for cocaine, your surgery will be cancelled. ? ?On the morning of surgery brush your teeth with toothpaste and water, you may  rinse your mouth with mouthwash if you wish. ?Do not swallow any toothpaste or mouthwash. ? ?Use CHG Soap as directed on instruction sheet. ? ?Do not wear jewelry, make-up, hairpins, clips or nail polish. ? ?Do not wear lotions, powders, or perfumes.  ? ?Do not shave body from the neck down 48 hours prior to surgery just in case you cut yourself which could leave a site for infection.  ?Also, freshly shaved skin may become irritated if using the CHG soap. ? ?Contact lenses, hearing aids and dentures may not be worn into surgery. ? ?Do not bring valuables to the hospital. [facility] is not responsible for any missing/lost belongings or valuables.  ? ?Notify your doctor if there is any change in your medical condition (cold, fever, infection). ? ?Wear comfortable clothing (specific to your surgery type) to the hospital. ? ?After surgery, you can help prevent lung complications by doing breathing exercises.  ?Take deep breaths and cough every 1-2 hours. Your doctor may order a device called an Incentive Spirometer to help you take deep breaths. ?When coughing or sneezing, hold a pillow firmly against your incision with both hands. This is called ?splinting.? Doing this helps protect your incision. It also decreases belly discomfort. ? ?If you are being admitted to the hospital overnight, leave your suitcase in the car. ?After surgery it may be brought to your room. ? ?If you are being discharged the day of surgery, you will not be allowed to drive home. ?You will need  a responsible adult (18 years or older) to drive you home and stay with you that night.  ? ?If you are taking public transportation, you will need to have a responsible adult (18 years or older) with you. ?Please confirm with your physician that it is acceptable to use public transportation.  ? ?Please call the [facility] at [phone] if you have any questions about these instructions. ? ?Surgery Visitation Policy: ? ?Patients  undergoing a surgery or procedure may have two family members or support persons with them as long as the person is not COVID-19 positive or experiencing its symptoms.  ? ?Inpatient Visitation:   ? ?Visiting hours are 7 a.m. to 8 p.m. ?Up to four visitors are allowed at one time in a patient room, including children. The visitors may rotate out with other people during the day. One designated support person (adult) may remain overnight.  ?

## 2021-12-18 ENCOUNTER — Other Ambulatory Visit: Payer: Managed Care, Other (non HMO)

## 2021-12-19 ENCOUNTER — Encounter
Admission: RE | Admit: 2021-12-19 | Discharge: 2021-12-19 | Disposition: A | Discharge status: Home / Self Care | Payer: Managed Care, Other (non HMO) | Source: Ambulatory Visit | Attending: Obstetrics and Gynecology | Admitting: Obstetrics and Gynecology

## 2021-12-19 DIAGNOSIS — Z01812 Encounter for preprocedural laboratory examination: Secondary | ICD-10-CM | POA: Diagnosis present

## 2021-12-19 DIAGNOSIS — Z01818 Encounter for other preprocedural examination: Secondary | ICD-10-CM

## 2021-12-19 LAB — TYPE AND SCREEN
ABO/RH(D): O POS
Antibody Screen: NEGATIVE

## 2021-12-19 LAB — HCG, QUANTITATIVE, PREGNANCY: hCG, Beta Chain, Quant, S: 1 m[IU]/mL (ref <5)

## 2021-12-22 ENCOUNTER — Encounter: Payer: Self-pay | Admitting: Obstetrics and Gynecology

## 2021-12-22 ENCOUNTER — Other Ambulatory Visit: Payer: Self-pay

## 2021-12-22 ENCOUNTER — Ambulatory Visit: Payer: Managed Care, Other (non HMO) | Admitting: Anesthesiology

## 2021-12-22 ENCOUNTER — Ambulatory Visit
Admission: RE | Admit: 2021-12-22 | Discharge: 2021-12-22 | Disposition: A | Discharge status: Home / Self Care | Payer: Managed Care, Other (non HMO) | Attending: Obstetrics and Gynecology | Admitting: Obstetrics and Gynecology

## 2021-12-22 ENCOUNTER — Encounter
Admission: RE | Disposition: A | Discharge status: Home / Self Care | Payer: Self-pay | Source: Home / Self Care | Attending: Obstetrics and Gynecology

## 2021-12-22 ENCOUNTER — Other Ambulatory Visit: Payer: Self-pay | Admitting: Obstetrics and Gynecology

## 2021-12-22 DIAGNOSIS — Z419 Encounter for procedure for purposes other than remedying health state, unspecified: Secondary | ICD-10-CM

## 2021-12-22 DIAGNOSIS — N72 Inflammatory disease of cervix uteri: Secondary | ICD-10-CM | POA: Insufficient documentation

## 2021-12-22 DIAGNOSIS — G8918 Other acute postprocedural pain: Secondary | ICD-10-CM

## 2021-12-22 DIAGNOSIS — N946 Dysmenorrhea, unspecified: Secondary | ICD-10-CM | POA: Insufficient documentation

## 2021-12-22 DIAGNOSIS — T8389XA Other specified complication of genitourinary prosthetic devices, implants and grafts, initial encounter: Secondary | ICD-10-CM | POA: Diagnosis not present

## 2021-12-22 DIAGNOSIS — N921 Excessive and frequent menstruation with irregular cycle: Secondary | ICD-10-CM

## 2021-12-22 DIAGNOSIS — D259 Leiomyoma of uterus, unspecified: Secondary | ICD-10-CM | POA: Diagnosis not present

## 2021-12-22 DIAGNOSIS — Z30432 Encounter for removal of intrauterine contraceptive device: Secondary | ICD-10-CM | POA: Diagnosis not present

## 2021-12-22 DIAGNOSIS — N920 Excessive and frequent menstruation with regular cycle: Secondary | ICD-10-CM | POA: Insufficient documentation

## 2021-12-22 DIAGNOSIS — R7303 Prediabetes: Secondary | ICD-10-CM | POA: Insufficient documentation

## 2021-12-22 HISTORY — PX: LAPAROSCOPIC VAGINAL HYSTERECTOMY WITH SALPINGECTOMY: SHX6680

## 2021-12-22 LAB — ABO/RH: ABO/RH(D): O POS

## 2021-12-22 LAB — POCT PREGNANCY, URINE: Preg Test, Ur: NEGATIVE

## 2021-12-22 SURGERY — HYSTERECTOMY, VAGINAL, LAPAROSCOPY-ASSISTED, WITH SALPINGECTOMY
Anesthesia: General | Site: Vagina | Laterality: Bilateral

## 2021-12-22 MED ORDER — DEXMEDETOMIDINE (PRECEDEX) IN NS 20 MCG/5ML (4 MCG/ML) IV SYRINGE
PREFILLED_SYRINGE | INTRAVENOUS | Status: DC | PRN
  Administered 2021-12-22: 6 ug via INTRAVENOUS
  Administered 2021-12-22: 4 ug via INTRAVENOUS
  Administered 2021-12-22: 10 ug via INTRAVENOUS

## 2021-12-22 MED ORDER — DEXAMETHASONE SODIUM PHOSPHATE 10 MG/ML IJ SOLN
INTRAMUSCULAR | Status: AC
  Filled 2021-12-22: qty 1

## 2021-12-22 MED ORDER — ONDANSETRON HCL 4 MG/2ML IJ SOLN
INTRAMUSCULAR | Status: AC
  Filled 2021-12-22: qty 2

## 2021-12-22 MED ORDER — SODIUM CHLORIDE (PF) 0.9 % IJ SOLN
INTRAMUSCULAR | Status: AC
  Filled 2021-12-22: qty 50

## 2021-12-22 MED ORDER — EPHEDRINE 5 MG/ML INJ
INTRAVENOUS | Status: AC
  Filled 2021-12-22: qty 5

## 2021-12-22 MED ORDER — DIPHENHYDRAMINE HCL 25 MG PO CAPS
25.0000 mg | ORAL_CAPSULE | Freq: Once | ORAL | Status: AC
  Administered 2021-12-22: 25 mg via ORAL

## 2021-12-22 MED ORDER — ONDANSETRON HCL 4 MG/2ML IJ SOLN: 4.0000 mg | Freq: Four times a day (QID) | INTRAMUSCULAR | Status: DC | PRN

## 2021-12-22 MED ORDER — KETAMINE HCL 50 MG/5ML IJ SOSY
PREFILLED_SYRINGE | INTRAMUSCULAR | Status: AC
  Filled 2021-12-22: qty 5

## 2021-12-22 MED ORDER — PHENYLEPHRINE 80 MCG/ML (10ML) SYRINGE FOR IV PUSH (FOR BLOOD PRESSURE SUPPORT)
PREFILLED_SYRINGE | INTRAVENOUS | Status: DC | PRN
Start: 2021-12-22 — End: 2021-12-22
  Administered 2021-12-22: 80 ug via INTRAVENOUS
  Administered 2021-12-22 (×2): 160 ug via INTRAVENOUS

## 2021-12-22 MED ORDER — CEFAZOLIN IN SODIUM CHLORIDE 3-0.9 GM/100ML-% IV SOLN
3.0000 g | INTRAVENOUS | Status: DC
  Filled 2021-12-22: qty 100

## 2021-12-22 MED ORDER — ORAL CARE MOUTH RINSE: 15.0000 mL | Freq: Once | OROMUCOSAL | Status: AC

## 2021-12-22 MED ORDER — OXYCODONE HCL 5 MG/5ML PO SOLN: 5.0000 mg | Freq: Once | ORAL | Status: AC | PRN

## 2021-12-22 MED ORDER — DIPHENHYDRAMINE HCL 25 MG PO CAPS
ORAL_CAPSULE | ORAL | Status: AC
  Filled 2021-12-22: qty 1

## 2021-12-22 MED ORDER — FENTANYL CITRATE (PF) 100 MCG/2ML IJ SOLN
INTRAMUSCULAR | Status: AC
  Administered 2021-12-22: 50 ug via INTRAVENOUS
  Filled 2021-12-22: qty 2

## 2021-12-22 MED ORDER — OXYCODONE HCL 5 MG PO TABS: 5.0000 mg | ORAL_TABLET | Freq: Once | ORAL | Status: AC | PRN

## 2021-12-22 MED ORDER — MIDAZOLAM HCL 2 MG/2ML IJ SOLN
INTRAMUSCULAR | Status: AC
  Filled 2021-12-22: qty 2

## 2021-12-22 MED ORDER — MIDAZOLAM HCL 2 MG/2ML IJ SOLN
INTRAMUSCULAR | Status: DC | PRN
  Administered 2021-12-22: 2 mg via INTRAVENOUS

## 2021-12-22 MED ORDER — PHENYLEPHRINE HCL-NACL 20-0.9 MG/250ML-% IV SOLN
INTRAVENOUS | Status: DC | PRN
  Administered 2021-12-22: 40 ug/min via INTRAVENOUS

## 2021-12-22 MED ORDER — ROCURONIUM BROMIDE 100 MG/10ML IV SOLN
INTRAVENOUS | Status: DC | PRN
  Administered 2021-12-22: 10 mg via INTRAVENOUS
  Administered 2021-12-22: 50 mg via INTRAVENOUS
  Administered 2021-12-22 (×3): 10 mg via INTRAVENOUS

## 2021-12-22 MED ORDER — CHLORHEXIDINE GLUCONATE 0.12 % MT SOLN
OROMUCOSAL | Status: AC
  Administered 2021-12-22: 15 mL via OROMUCOSAL
  Filled 2021-12-22: qty 15

## 2021-12-22 MED ORDER — LIDOCAINE HCL (PF) 2 % IJ SOLN
INTRAMUSCULAR | Status: AC
  Filled 2021-12-22: qty 5

## 2021-12-22 MED ORDER — FENTANYL CITRATE (PF) 100 MCG/2ML IJ SOLN
25.0000 ug | INTRAMUSCULAR | Status: DC | PRN
  Administered 2021-12-22 (×2): 25 ug via INTRAVENOUS

## 2021-12-22 MED ORDER — CHLORHEXIDINE GLUCONATE 0.12 % MT SOLN: 15.0000 mL | Freq: Once | OROMUCOSAL | Status: AC

## 2021-12-22 MED ORDER — LACTATED RINGERS IV SOLN: INTRAVENOUS | Status: DC

## 2021-12-22 MED ORDER — OXYCODONE-ACETAMINOPHEN 5-325 MG PO TABS
1.0000 | ORAL_TABLET | ORAL | 0 refills | Status: DC | PRN
Start: 2021-12-22 — End: 2021-12-22

## 2021-12-22 MED ORDER — CEFAZOLIN SODIUM-DEXTROSE 2-3 GM-%(50ML) IV SOLR
INTRAVENOUS | Status: DC | PRN
  Administered 2021-12-22: 3 g via INTRAVENOUS

## 2021-12-22 MED ORDER — 0.9 % SODIUM CHLORIDE (POUR BTL) OPTIME
TOPICAL | Status: DC | PRN
  Administered 2021-12-22: 500 mL

## 2021-12-22 MED ORDER — ONDANSETRON 4 MG PO TBDP
4.0000 mg | ORAL_TABLET | Freq: Four times a day (QID) | ORAL | Status: DC | PRN
Start: 2021-12-22 — End: 2021-12-22

## 2021-12-22 MED ORDER — POVIDONE-IODINE 10 % EX SWAB: 2.0000 "application " | Freq: Once | CUTANEOUS | Status: DC

## 2021-12-22 MED ORDER — VASOPRESSIN 20 UNIT/ML IV SOLN
INTRAVENOUS | Status: DC | PRN
  Administered 2021-12-22: 11 mL via INTRAMUSCULAR

## 2021-12-22 MED ORDER — EPHEDRINE SULFATE (PRESSORS) 50 MG/ML IJ SOLN
INTRAMUSCULAR | Status: DC | PRN
Start: 2021-12-22 — End: 2021-12-22
  Administered 2021-12-22: 5 mg via INTRAVENOUS

## 2021-12-22 MED ORDER — KETOROLAC TROMETHAMINE 30 MG/ML IJ SOLN
INTRAMUSCULAR | Status: AC
  Filled 2021-12-22: qty 1

## 2021-12-22 MED ORDER — FAMOTIDINE 20 MG PO TABS
20.0000 mg | ORAL_TABLET | Freq: Once | ORAL | Status: AC
Start: 2021-12-22 — End: 2021-12-22

## 2021-12-22 MED ORDER — BUPIVACAINE-MELOXICAM ER 200-6 MG/7ML IJ SOLN
INTRAMUSCULAR | Status: DC | PRN
  Administered 2021-12-22: 3 mL

## 2021-12-22 MED ORDER — ESMOLOL HCL 100 MG/10ML IV SOLN
INTRAVENOUS | Status: AC
  Filled 2021-12-22: qty 10

## 2021-12-22 MED ORDER — KETAMINE HCL 10 MG/ML IJ SOLN
INTRAMUSCULAR | Status: DC | PRN
  Administered 2021-12-22: 30 mg via INTRAVENOUS

## 2021-12-22 MED ORDER — FAMOTIDINE 20 MG PO TABS
ORAL_TABLET | ORAL | Status: AC
  Administered 2021-12-22: 20 mg via ORAL
  Filled 2021-12-22: qty 1

## 2021-12-22 MED ORDER — KETOROLAC TROMETHAMINE 30 MG/ML IJ SOLN: 30.0000 mg | Freq: Once | INTRAMUSCULAR | Status: DC

## 2021-12-22 MED ORDER — ACETAMINOPHEN 10 MG/ML IV SOLN
INTRAVENOUS | Status: AC
  Filled 2021-12-22: qty 100

## 2021-12-22 MED ORDER — VASOPRESSIN 20 UNIT/ML IV SOLN
INTRAVENOUS | Status: AC
  Filled 2021-12-22: qty 1

## 2021-12-22 MED ORDER — ROCURONIUM BROMIDE 10 MG/ML (PF) SYRINGE
PREFILLED_SYRINGE | INTRAVENOUS | Status: AC
  Filled 2021-12-22: qty 10

## 2021-12-22 MED ORDER — KETOROLAC TROMETHAMINE 30 MG/ML IJ SOLN
INTRAMUSCULAR | Status: DC | PRN
  Administered 2021-12-22: 30 mg via INTRAVENOUS

## 2021-12-22 MED ORDER — DEXAMETHASONE SODIUM PHOSPHATE 10 MG/ML IJ SOLN
INTRAMUSCULAR | Status: DC | PRN
  Administered 2021-12-22: 10 mg via INTRAVENOUS

## 2021-12-22 MED ORDER — OXYCODONE HCL 5 MG PO TABS
ORAL_TABLET | ORAL | Status: AC
  Administered 2021-12-22: 5 mg via ORAL
  Filled 2021-12-22: qty 1

## 2021-12-22 MED ORDER — ACETAMINOPHEN-CODEINE 300-30 MG PO TABS: 1.0000 | ORAL_TABLET | ORAL | 0 refills | Status: DC | PRN

## 2021-12-22 MED ORDER — FENTANYL CITRATE (PF) 100 MCG/2ML IJ SOLN
INTRAMUSCULAR | Status: DC | PRN
  Administered 2021-12-22 (×2): 50 ug via INTRAVENOUS

## 2021-12-22 MED ORDER — OXYCODONE-ACETAMINOPHEN 5-325 MG PO TABS: 1.0000 | ORAL_TABLET | ORAL | Status: DC | PRN

## 2021-12-22 MED ORDER — PROPOFOL 10 MG/ML IV BOLUS
INTRAVENOUS | Status: DC | PRN
Start: 2021-12-22 — End: 2021-12-22
  Administered 2021-12-22: 200 mg via INTRAVENOUS

## 2021-12-22 MED ORDER — BUPIVACAINE-MELOXICAM ER 200-6 MG/7ML IJ SOLN
INTRAMUSCULAR | Status: AC
  Filled 2021-12-22: qty 1

## 2021-12-22 MED ORDER — LIDOCAINE HCL (CARDIAC) PF 100 MG/5ML IV SOSY
PREFILLED_SYRINGE | INTRAVENOUS | Status: DC | PRN
  Administered 2021-12-22: 100 mg via INTRAVENOUS

## 2021-12-22 MED ORDER — FENTANYL CITRATE (PF) 100 MCG/2ML IJ SOLN
INTRAMUSCULAR | Status: AC
  Filled 2021-12-22: qty 2

## 2021-12-22 MED ORDER — BUPIVACAINE HCL (PF) 0.5 % IJ SOLN
INTRAMUSCULAR | Status: AC
  Filled 2021-12-22: qty 30

## 2021-12-22 MED ORDER — ACETAMINOPHEN 10 MG/ML IV SOLN
INTRAVENOUS | Status: DC | PRN
  Administered 2021-12-22: 1000 mg via INTRAVENOUS

## 2021-12-22 MED ORDER — SUGAMMADEX SODIUM 200 MG/2ML IV SOLN
INTRAVENOUS | Status: DC | PRN
  Administered 2021-12-22: 100 mg via INTRAVENOUS
  Administered 2021-12-22: 200 mg via INTRAVENOUS

## 2021-12-22 MED ORDER — PHENYLEPHRINE HCL-NACL 20-0.9 MG/250ML-% IV SOLN
INTRAVENOUS | Status: AC
  Filled 2021-12-22: qty 250

## 2021-12-22 MED ORDER — SEVOFLURANE IN SOLN
RESPIRATORY_TRACT | Status: AC
Start: 2021-12-22 — End: ?
  Filled 2021-12-22: qty 250

## 2021-12-22 MED ORDER — ONDANSETRON HCL 4 MG/2ML IJ SOLN
INTRAMUSCULAR | Status: DC | PRN
  Administered 2021-12-22 (×2): 4 mg via INTRAVENOUS

## 2021-12-22 MED ORDER — DEXTROSE IN LACTATED RINGERS 5 % IV SOLN: INTRAVENOUS | Status: DC

## 2021-12-22 MED ORDER — IBUPROFEN 600 MG PO TABS: 600.0000 mg | ORAL_TABLET | Freq: Four times a day (QID) | ORAL | 3 refills | Status: AC | PRN

## 2021-12-22 MED ORDER — PROPOFOL 10 MG/ML IV BOLUS
INTRAVENOUS | Status: AC
  Filled 2021-12-22: qty 40

## 2021-12-22 SURGICAL SUPPLY — 54 items
ADH LQ OCL WTPRF AMP STRL LF (MISCELLANEOUS) ×1
ADH SKN CLS APL DERMABOND .7 (GAUZE/BANDAGES/DRESSINGS) ×1
ADHESIVE MASTISOL STRL (MISCELLANEOUS) ×2 IMPLANT
APL PRP STRL LF DISP 70% ISPRP (MISCELLANEOUS) ×1
BACTOSHIELD CHG 4% 4OZ (MISCELLANEOUS) ×1
BAG DRN RND TRDRP ANRFLXCHMBR (UROLOGICAL SUPPLIES)
BAG URINE DRAIN 2000ML AR STRL (UROLOGICAL SUPPLIES) ×1 IMPLANT
BLADE SURG 10 STRL SS SAFETY (BLADE) ×2 IMPLANT
BLADE SURG SZ11 CARB STEEL (BLADE) ×2 IMPLANT
CATH FOLEY 2WAY  5CC 16FR (CATHETERS) ×1
CATH FOLEY 2WAY 5CC 16FR (CATHETERS) ×1
CATH URTH 16FR FL 2W BLN LF (CATHETERS) ×1 IMPLANT
CHLORAPREP W/TINT 26 (MISCELLANEOUS) ×2 IMPLANT
DERMABOND ADVANCED (GAUZE/BANDAGES/DRESSINGS) ×1
DERMABOND ADVANCED .7 DNX12 (GAUZE/BANDAGES/DRESSINGS) ×1 IMPLANT
ELECT REM PT RETURN 9FT ADLT (ELECTROSURGICAL) ×2
ELECTRODE REM PT RTRN 9FT ADLT (ELECTROSURGICAL) ×1 IMPLANT
GAUZE 4X4 16PLY ~~LOC~~+RFID DBL (SPONGE) ×4 IMPLANT
GLOVE ORTHO TXT STRL SZ7.5 (GLOVE) ×4 IMPLANT
GOWN STRL REUS W/ TWL LRG LVL3 (GOWN DISPOSABLE) ×2 IMPLANT
GOWN STRL REUS W/ TWL XL LVL3 (GOWN DISPOSABLE) ×2 IMPLANT
GOWN STRL REUS W/TWL LRG LVL3 (GOWN DISPOSABLE) ×4
GOWN STRL REUS W/TWL XL LVL3 (GOWN DISPOSABLE) ×4
HANDLE YANKAUER SUCT BULB TIP (MISCELLANEOUS) IMPLANT
IRRIGATION STRYKERFLOW (MISCELLANEOUS) IMPLANT
IRRIGATOR STRYKERFLOW (MISCELLANEOUS)
KIT PINK PAD W/HEAD ARE REST (MISCELLANEOUS) ×2
KIT PINK PAD W/HEAD ARM REST (MISCELLANEOUS) ×1 IMPLANT
KIT TURNOVER CYSTO (KITS) ×2 IMPLANT
LIGASURE LAP MARYLAND 5MM 37CM (ELECTROSURGICAL) ×1 IMPLANT
MANIFOLD NEPTUNE II (INSTRUMENTS) ×2 IMPLANT
NDL SPNL 22GX3.5 QUINCKE BK (NEEDLE) ×1 IMPLANT
NEEDLE SPNL 22GX3.5 QUINCKE BK (NEEDLE) ×2 IMPLANT
NS IRRIG 500ML POUR BTL (IV SOLUTION) ×1 IMPLANT
PACK BASIN MINOR ARMC (MISCELLANEOUS) ×2 IMPLANT
PACK GYN LAPAROSCOPIC (MISCELLANEOUS) ×2 IMPLANT
PAD OB MATERNITY 4.3X12.25 (PERSONAL CARE ITEMS) ×2 IMPLANT
PAD PREP 24X41 OB/GYN DISP (PERSONAL CARE ITEMS) ×2 IMPLANT
PENCIL SMOKE EVACUATOR (MISCELLANEOUS) ×2 IMPLANT
RETRACTOR PHONTONGUIDE ADAPT (ADAPTER) IMPLANT
SCRUB CHG 4% DYNA-HEX 4OZ (MISCELLANEOUS) ×1 IMPLANT
SLEEVE ENDOPATH XCEL 5M (ENDOMECHANICALS) ×4 IMPLANT
SOLUTION ELECTROLUBE (MISCELLANEOUS) IMPLANT
SPONGE T-LAP 18X18 ~~LOC~~+RFID (SPONGE) ×2 IMPLANT
STRIP CLOSURE SKIN 1/2X4 (GAUZE/BANDAGES/DRESSINGS) IMPLANT
SUT VIC AB 0 CT1 27 (SUTURE) ×4
SUT VIC AB 0 CT1 27XCR 8 STRN (SUTURE) ×2 IMPLANT
SUT VIC AB 0 CT1 36 (SUTURE) ×4 IMPLANT
SUT VIC AB 4-0 FS2 27 (SUTURE) ×2 IMPLANT
SYR 10ML LL (SYRINGE) ×2 IMPLANT
SYR CONTROL 10ML LL (SYRINGE) ×2 IMPLANT
TAPE TRANSPORE STRL 2 31045 (GAUZE/BANDAGES/DRESSINGS) ×2 IMPLANT
TROCAR XCEL NON-BLD 5MMX100MML (ENDOMECHANICALS) ×2 IMPLANT
TUBING EVAC SMOKE HEATED PNEUM (TUBING) ×2 IMPLANT

## 2021-12-22 NOTE — Op Note (Addendum)
? ? ?  OPERATIVE NOTE ?12/22/2021 ?9:42 AM ? ?PRE-OPERATIVE DIAGNOSIS:  ?1) Uterine Fibroids, Dysmenorrhea, Menorrhagia ?2) Failed hormonal control - Failed IUD ? ?POST-OPERATIVE DIAGNOSIS:  ?1) Same ? ?OPERATION: Procedure(s) (LRB): ?LAPAROSCOPIC ASSISTED VAGINAL HYSTERECTOMY WITH SALPINGECTOMY AND INTRAUTERINE DEVICE REMOVAL (Bilateral) ?   ?SURGEON(S): Surgeon(s) and Role: ?   Harlin Heys, MD - Primary ?   Rubie Maid, MD No other capable assistant was available for this surgery which requires an experienced, high level assistant.  ? ?ANESTHESIA: General ? ?ESTIMATED BLOOD LOSS: 100 mL ? ?OPERATIVE FINDINGS: Multiple uterine fibroids.  Normal ovaries. ? ?SPECIMEN:  ?ID Type Source Tests Collected by Time Destination  ?1 : CERVIX, UTERUS, BILATERAL FALLOPIAN TUBES Tissue PATH Other SURGICAL PATHOLOGY Harlin Heys, MD 12/22/2021 9371   ? ? ?COMPLICATIONS: None ? ?DRAINS: Foley to gravity ? ?DISPOSITION: Stable to recovery room ? ?DESCRIPTION OF PROCEDURE: ?     The patient was prepped and draped in the dorsolithotomy position and placed under general anesthesia. The bladder was emptied. The cervix was grasped with a multi-toothed tenaculum and a uterine manipulator was placed within the cervical os respecting the position and curvature of the uterus. ?After changing gloves we proceeded abdominally. A small infraumbilical incision was made and a 5 mm trocar port was placed within the abdominopelvic cavity. The opening pressure was less than 7 mmHg.  Approximately 3 and 1/2 L of carbon dioxide gas was instilled within the abdominal pelvic cavity. The laparoscope was placed and the pelvis and abdomen were carefully inspected. In the usual manner, under direct visualization right and left lower quadrant ports of 5 mm size were placed. Both ureters were identified in the pelvis prior to dissection or clamping and cutting of pedicles. ?The fallopian tubes were elevated and the mesenteric side  systematically coagulated and divided allowing the tube to be removed at the time of uterine removal. The round ligaments were coagulated and divided and a bladder flap was created. The upper aspect of the broad ligament was clamped coagulated and divided. The uterine arteries were skeletonized, triply coagulated and divided. Careful inspection of all pedicles and the remainder of the pelvis was performed. Hemostasis was noted. ?The lower quadrant ports were removed, hemostasis of the port sites was noted, and the incisions were closed in subcuticular manner. The laparoscope and trocar sleeve were removed from the infraumbilical incision, hemostasis was noted, and the incision was closed in a subcuticular manner. A long-acting anesthetic was employed in the skin incisions. ?We then proceeded vaginally. ?A weighted speculum was placed posteriorly. A multi-toothed tenaculum was used to grasp the cervix and the cervix was injected in a circumferential manner with a dilute Pitressin solution. An incision was made around the cervix and the vaginal mucosa was dissected off of the cervix. The posterior cul-de-sac was identified and entered and the weighted speculum was placed within this. The anterior cul-de-sac was identified and entered and a retractor was placed and used to retract the bladder anteriorly keeping it out of the operative field. The uterosacral ligaments were clamped divided and suture ligated. The cardinal ligaments were clamped divided and suture ligated. The small remaining pedicle was clamped divided and suture ligated bilaterally allowing delivery of the specimen. ?Angle sutures were placed in the usual manner. A culdoplasty was performed. The peritoneum was identified anteriorly and then incorporating the left upper pedicle left lower pedicle right lower pedicle right upper pedicle and anterior peritoneum a pursestring suture was placed exteriorizing all pedicles.  Hemostasis of all pedicles was noted  at this time. The vaginal mucosa was then closed with a running suture of Vicryl. ?Dr. Marcelline Mates provided exposure, dissection, suctioning, retraction, and general support and assistance during the procedure. ?The patient went to recovery room in stable condition.  ? ?Finis Bud, M.D. ?12/22/2021 ?9:42 AM ? ?

## 2021-12-22 NOTE — Anesthesia Preprocedure Evaluation (Signed)
Anesthesia Evaluation  ?Patient identified by MRN, date of birth, ID band ?Patient awake ? ? ? ?Reviewed: ?Allergy & Precautions, NPO status , Patient's Chart, lab work & pertinent test results ? ?Airway ?Mallampati: III ? ?TM Distance: >3 FB ?Neck ROM: full ? ? ? Dental ? ?(+) Chipped ?  ?Pulmonary ?neg shortness of breath, sleep apnea ,  ?  ?Pulmonary exam normal ? ? ? ? ? ? ? Cardiovascular ?(-) angina(-) Past MI negative cardio ROS ?Normal cardiovascular exam ? ? ?  ?Neuro/Psych ?negative neurological ROS ? negative psych ROS  ? GI/Hepatic ?negative GI ROS, Neg liver ROS, neg GERD  ,  ?Endo/Other  ?negative endocrine ROS ? Renal/GU ?  ? ?  ?Musculoskeletal ? ? Abdominal ?  ?Peds ? Hematology ?negative hematology ROS ?(+)   ?Anesthesia Other Findings ?Past Medical History: ?No date: History of kidney stones ?No date: Pre-diabetes ?No date: Submucous uterine fibroid ? ?Past Surgical History: ?07/04/2014: URETEROSCOPY; Left ?    Comment:  stent placement ? ?BMI   ? Body Mass Index: 39.87 kg/m?  ?  ? ? Reproductive/Obstetrics ?negative OB ROS ? ?  ? ? ? ? ? ? ? ? ? ? ? ? ? ?  ?  ? ? ? ? ? ? ? ? ?Anesthesia Physical ?Anesthesia Plan ? ?ASA: 3 ? ?Anesthesia Plan: General ETT  ? ?Post-op Pain Management:   ? ?Induction: Intravenous ? ?PONV Risk Score and Plan: Ondansetron, Dexamethasone, Midazolam and Treatment may vary due to age or medical condition ? ?Airway Management Planned: Oral ETT ? ?Additional Equipment:  ? ?Intra-op Plan:  ? ?Post-operative Plan: Extubation in OR ? ?Informed Consent: I have reviewed the patients History and Physical, chart, labs and discussed the procedure including the risks, benefits and alternatives for the proposed anesthesia with the patient or authorized representative who has indicated his/her understanding and acceptance.  ? ? ? ?Dental Advisory Given ? ?Plan Discussed with: Anesthesiologist, CRNA and Surgeon ? ?Anesthesia Plan Comments: (Patient  consented for risks of anesthesia including but not limited to:  ?- adverse reactions to medications ?- damage to eyes, teeth, lips or other oral mucosa ?- nerve damage due to positioning  ?- sore throat or hoarseness ?- Damage to heart, brain, nerves, lungs, other parts of body or loss of life ? ?Patient voiced understanding.)  ? ? ? ? ? ? ?Anesthesia Quick Evaluation ? ?

## 2021-12-22 NOTE — Discharge Instructions (Addendum)
AMBULATORY SURGERY  ?DISCHARGE INSTRUCTIONS ? ? ?The drugs that you were given will stay in your system until tomorrow so for the next 24 hours you should not: ? ?Drive an automobile ?Make any legal decisions ?Drink any alcoholic beverage ? ? ?You may resume regular meals tomorrow.  Today it is better to start with liquids and gradually work up to solid foods. ? ?You may eat anything you prefer, but it is better to start with liquids, then soup and crackers, and gradually work up to solid foods. ? ? ?Please notify your doctor immediately if you have any unusual bleeding, trouble breathing, redness and pain at the surgery site, drainage, fever, or pain not relieved by medication. ? ?  ? ?Your post-operative visit with Dr. Amalia Hailey            ?           ?     is: Date:   01/06/2022                     Time:  6195KD ? ? ? ? ? ?

## 2021-12-22 NOTE — Transfer of Care (Signed)
Immediate Anesthesia Transfer of Care Note ? ?Patient: Jill Mcmillan ? ?Procedure(s) Performed: LAPAROSCOPIC ASSISTED VAGINAL HYSTERECTOMY WITH SALPINGECTOMY AND INTRAUTERINE DEVICE REMOVAL (Bilateral: Vagina ) ? ?Patient Location: PACU ? ?Anesthesia Type:General ? ?Level of Consciousness: drowsy ? ?Airway & Oxygen Therapy: Patient Spontanous Breathing and Patient connected to face mask ? ?Post-op Assessment: Report given to RN and Post -op Vital signs reviewed and stable ? ?Post vital signs: Reviewed and stable ? ?Last Vitals:  ?Vitals Value Taken Time  ?BP 122/82 12/22/21 0940  ?Temp    ?Pulse 88 12/22/21 0944  ?Resp 14 12/22/21 0944  ?SpO2 100 % 12/22/21 0944  ?Vitals shown include unvalidated device data. ? ?Last Pain:  ?Vitals:  ? 12/22/21 0629  ?TempSrc: Temporal  ?PainSc: 0-No pain  ?   ? ?  ? ?Complications: No notable events documented. ?

## 2021-12-22 NOTE — H&P (Signed)
?  ?                                            PRE-OPERATIVE HISTORY AND PHYSICAL EXAM ?  ? ?Subjective:  ?  ?HPI:  Jill Mcmillan is a 41 y.o. G2P1011.   ?  ?She has the following symptoms: Menorrhagia, uterine fibroids (submucosal) -failed IUD.   ?  ?Review of Systems:  ?  ?Constitutional: Denied constitutional symptoms, night sweats, recent illness, fatigue, fever, insomnia and weight loss.  ?Eyes: Denied eye symptoms, eye pain, photophobia, vision change and visual disturbance.  ?Ears/Nose/Throat/Neck: Denied ear, nose, throat or neck symptoms, hearing loss, nasal discharge, sinus congestion and sore throat.  ?Cardiovascular: Denied cardiovascular symptoms, arrhythmia, chest pain/pressure, edema, exercise intolerance, orthopnea and palpitations.  ?Respiratory: Denied pulmonary symptoms, asthma, pleuritic pain, productive sputum, cough, dyspnea and wheezing.  ?Gastrointestinal: Denied, gastro-esophageal reflux, melena, nausea and vomiting.  ?Genitourinary: See HPI for additional information.  ?Musculoskeletal: Denied musculoskeletal symptoms, stiffness, swelling, muscle weakness and myalgia.  ?Dermatologic: Denied dermatology symptoms, rash and scar.  ?Neurologic: Denied neurology symptoms, dizziness, headache, neck pain and syncope.  ?Psychiatric: Denied psychiatric symptoms, anxiety and depression.  ?Endocrine: Denied endocrine symptoms including hot flashes and night sweats.  ?  ?                ?OB History  ?Gravida Para Term Preterm AB Living  ?'2 1 1   1 1  '$ ?SAB IAB Ectopic Multiple Live Births     ?  1     1     ?   ?# Outcome Date GA Lbr Len/2nd Weight Sex Delivery Anes PTL Lv  ?2 Term 2003     5 lb (2.268 kg)   Vag-Spont     LIV  ?1 IAB 2001                  ?  ?  ?History reviewed. No pertinent past medical history. ?  ?History reviewed. No pertinent surgical history.   ?  ?SOCIAL HISTORY: ?  ?Social History  ?  ?   ?Tobacco Use  ?Smoking Status Never  ?Smokeless Tobacco Never  ?  ?Social History  ?  ?     ?Substance and Sexual Activity  ?Alcohol Use Yes  ? Alcohol/week: 2.0 standard drinks  ? Types: 2 Glasses of wine per week  ?  Comment: occasionally  ? ?  ?Social History  ?  ?   ?Substance and Sexual Activity  ?Drug Use Not Currently  ?  ?  ?     ?Family History  ?Problem Relation Age of Onset  ? Breast cancer Mother    ? Hypertension Mother    ? Hypertension Father    ? COPD Father    ? Alcohol abuse Father    ?  ?  ?ALLERGIES:  Penicillins ?  ?MEDS: ?             ?      ?Current Outpatient Medications on File Prior to Visit  ?Medication Sig Dispense Refill  ? Cetirizine HCl (ZYRTEC PO) Take by mouth.      ? norethindrone (AYGESTIN) 5 MG tablet Take 1 tablet (5 mg total) by mouth daily. As directed 45 tablet 0  ?  ?No current facility-administered medications on file prior to visit.  ?  ?  ?No orders of the  defined types were placed in this encounter. ?  ?  ?Physical examination ? ?  ?General NAD, Conversant  ?HEENT Atraumatic; Op clear with mmm.  Normo-cephalic. Pupils reactive. Anicteric sclerae  ?Thyroid/Neck Smooth without nodularity or enlargement. Normal ROM.  Neck Supple.  ?Skin No rashes, lesions or ulceration. Normal palpated skin turgor. No nodularity.  ?Breasts: No masses or discharge.  Symmetric.  No axillary adenopathy.  ?Lungs: Clear to auscultation.No rales or wheezes. Normal Respiratory effort, no retractions.  ?Heart: NSR.  No murmurs or rubs appreciated. No periferal edema  ?Abdomen: Soft.  Non-tender.  No masses.  No HSM. No hernia  ?Extremities: Moves all appropriately.  Normal ROM for age. No lymphadenopathy.  ?Neuro: Oriented to PPT.  Normal mood. Normal affect.  ?  ?        ?  Pelvic:    ?Vulva: Normal appearance.  No lesions.   ?Vagina: No lesions or abnormalities noted.   ?Support: Normal pelvic support.   ?Urethra No masses tenderness or scarring.   ?Meatus Normal size without lesions or prolapse.   ?Cervix: Normal ectropion.  No lesions.   ?Anus: Normal exam.  No lesions.   ?Perineum:  Normal exam.  No lesions.   ?      Bimanual  Deferred to OR  ?   ? From US Findings:  ?The uterus is anteverted and measures 9.6 x 6.2 x 5.6 cm. ?Echo texture is heterogenous with evidence of focal masses. ?Within the uterus are multiple suspected fibroids measuring: ?Fibroid 1:  3.3 x 3.1 x 4.0 cm subserosal  ?Fibroid 2:   1.6 x 1.8 x 1.9 cm intramural ?Fibroid 3:   1.5 x 1.3 x 1.7 cm Fundal subserosal ?Fibroid 4:    1.4 x .9 x 1.0 cm Submucosal ?  ?            ?Assessment:  ?  ?G2P1011 ? ?  ?  ?1. Pre-op exam   ?2. Menorrhagia with irregular cycle   ?3. Fibroids, submucosal   ?4. Breakthrough bleeding associated with intrauterine device (IUD)   ?            ?            ?  ?Plan:  ?  ? ?  ?  ?1.  LAVH wit bilat sapingetcomy ?   ?  ?  ?  ?  ?  ?Electronically signed by Harlin Heys, MD ?

## 2021-12-22 NOTE — Anesthesia Procedure Notes (Signed)
Procedure Name: Intubation ?Date/Time: 12/22/2021 7:37 AM ?Performed by: Loletha Grayer, CRNA ?Pre-anesthesia Checklist: Patient identified, Patient being monitored, Timeout performed, Emergency Drugs available and Suction available ?Patient Re-evaluated:Patient Re-evaluated prior to induction ?Oxygen Delivery Method: Circle system utilized ?Preoxygenation: Pre-oxygenation with 100% oxygen ?Induction Type: IV induction ?Ventilation: Mask ventilation without difficulty and Oral airway inserted - appropriate to patient size ?Laryngoscope Size: 3 and McGraph ?Grade View: Grade I ?Tube type: Oral ?Tube size: 7.0 mm ?Number of attempts: 1 ?Airway Equipment and Method: Stylet ?Placement Confirmation: ETT inserted through vocal cords under direct vision, positive ETCO2 and breath sounds checked- equal and bilateral ?Secured at: 23 cm ?Tube secured with: Tape ?Dental Injury: Teeth and Oropharynx as per pre-operative assessment  ?Comments: Redundant tissue noted with McGrath. ? ? ? ? ?

## 2021-12-23 ENCOUNTER — Encounter: Payer: Self-pay | Admitting: Obstetrics and Gynecology

## 2021-12-23 ENCOUNTER — Telehealth: Payer: Self-pay

## 2021-12-23 LAB — SURGICAL PATHOLOGY

## 2021-12-23 NOTE — Anesthesia Postprocedure Evaluation (Signed)
Anesthesia Post Note ? ?Patient: Rosio Weiss ? ?Procedure(s) Performed: LAPAROSCOPIC ASSISTED VAGINAL HYSTERECTOMY WITH SALPINGECTOMY AND INTRAUTERINE DEVICE REMOVAL (Bilateral: Vagina ) ? ?Patient location during evaluation: PACU ?Anesthesia Type: General ?Level of consciousness: awake and alert ?Pain management: pain level controlled ?Vital Signs Assessment: post-procedure vital signs reviewed and stable ?Respiratory status: spontaneous breathing, nonlabored ventilation, respiratory function stable and patient connected to nasal cannula oxygen ?Cardiovascular status: blood pressure returned to baseline and stable ?Postop Assessment: no apparent nausea or vomiting ?Anesthetic complications: no ? ? ?No notable events documented. ? ? ?Last Vitals:  ?Vitals:  ? 12/22/21 1242 12/22/21 1407  ?BP: (!) 164/98 (!) 158/93  ?Pulse: 74 87  ?Resp: 18 18  ?Temp:    ?SpO2: 100% 100%  ?  ?Last Pain:  ?Vitals:  ? 12/23/21 1007  ?TempSrc:   ?PainSc: 2   ? ? ?  ?  ?  ?  ?  ?  ? ?Martha Clan ? ? ? ? ?

## 2021-12-23 NOTE — Telephone Encounter (Signed)
Called and spoke with patient to check on her post surgery. She states she is doing well, a tad sore but everything is manageable. Advised her if any questions or issues arise to call and let us know. Patient was appreciative of the check in.  ?

## 2022-01-05 ENCOUNTER — Encounter: Payer: Self-pay | Admitting: Obstetrics and Gynecology

## 2022-01-06 ENCOUNTER — Encounter: Payer: Managed Care, Other (non HMO) | Admitting: Obstetrics and Gynecology

## 2022-01-06 DIAGNOSIS — Z9889 Other specified postprocedural states: Secondary | ICD-10-CM

## 2022-01-14 ENCOUNTER — Ambulatory Visit (INDEPENDENT_AMBULATORY_CARE_PROVIDER_SITE_OTHER): Payer: Managed Care, Other (non HMO) | Admitting: Obstetrics and Gynecology

## 2022-01-14 ENCOUNTER — Encounter: Payer: Self-pay | Admitting: Obstetrics and Gynecology

## 2022-01-14 VITALS — BP 146/90 | HR 81 | Ht 69.0 in | Wt 265.8 lb

## 2022-01-14 DIAGNOSIS — Z9889 Other specified postprocedural states: Secondary | ICD-10-CM | POA: Diagnosis not present

## 2022-01-14 DIAGNOSIS — G8918 Other acute postprocedural pain: Secondary | ICD-10-CM | POA: Diagnosis not present

## 2022-01-14 DIAGNOSIS — N3001 Acute cystitis with hematuria: Secondary | ICD-10-CM

## 2022-01-14 LAB — POCT URINALYSIS DIPSTICK OB
Bilirubin, UA: NEGATIVE
Glucose, UA: NEGATIVE
Ketones, UA: NEGATIVE
Leukocytes, UA: NEGATIVE
Nitrite, UA: NEGATIVE
POC,PROTEIN,UA: NEGATIVE
Spec Grav, UA: 1.025 (ref 1.010–1.025)
Urobilinogen, UA: 0.2 E.U./dL
pH, UA: 6 (ref 5.0–8.0)

## 2022-01-14 MED ORDER — NITROFURANTOIN MONOHYD MACRO 100 MG PO CAPS
100.0000 mg | ORAL_CAPSULE | Freq: Two times a day (BID) | ORAL | 1 refills | Status: DC
Start: 2022-01-14 — End: 2022-02-12

## 2022-01-14 NOTE — Progress Notes (Signed)
HPI: ?     [name] is a 41 y.o. G2P1011 who LMP was No LMP recorded. (Menstrual status: IUD). ? ?Subjective:  ? ?She presents today approximately 2 weeks from LAVH.  She reports that she was doing well for the first week but over the last several days has had a constant dull midline abdominal pain.  She says her pain pills have helped but nothing entirely relieves it.  She does deny urinary symptoms.  She denies vaginal bleeding.  She denies fever. ? ?  Hx: ?The following portions of the patient's history were reviewed and updated as appropriate: ?            She  has a past medical history of History of kidney stones, Pre-diabetes, and Submucous uterine fibroid. ?She does not have a problem list on file. ?She  has a past surgical history that includes Ureteroscopy (Left, 07/04/2014) and Laparoscopic vaginal hysterectomy with salpingectomy (Bilateral, 12/22/2021). ?Her family history includes Alcohol abuse in her father; Breast cancer in her mother; COPD in her father; Hypertension in her father and mother. ?She  reports that she has never smoked. She has never used smokeless tobacco. She reports that she does not currently use alcohol after a past usage of about 2.0 standard drinks per week. She reports that she does not currently use drugs. ?She has a current medication list which includes the following prescription(s): cetirizine, ibuprofen, and nitrofurantoin (macrocrystal-monohydrate). ?She is allergic to percocet [oxycodone-acetaminophen]. ?      ?Review of Systems:  ?Review of Systems ? ?Constitutional: Denied constitutional symptoms, night sweats, recent illness, fatigue, fever, insomnia and weight loss.  ?Eyes: Denied eye symptoms, eye pain, photophobia, vision change and visual disturbance.  ?Ears/Nose/Throat/Neck: Denied ear, nose, throat or neck symptoms, hearing loss, nasal discharge, sinus congestion and sore throat.  ?Cardiovascular: Denied cardiovascular symptoms, arrhythmia, chest  pain/pressure, edema, exercise intolerance, orthopnea and palpitations.  ?Respiratory: Denied pulmonary symptoms, asthma, pleuritic pain, productive sputum, cough, dyspnea and wheezing.  ?Gastrointestinal: Denied, gastro-esophageal reflux, melena, nausea and vomiting.  ?Genitourinary: See HPI for additional information.  ?Musculoskeletal: Denied musculoskeletal symptoms, stiffness, swelling, muscle weakness and myalgia.  ?Dermatologic: Denied dermatology symptoms, rash and scar.  ?Neurologic: Denied neurology symptoms, dizziness, headache, neck pain and syncope.  ?Psychiatric: Denied psychiatric symptoms, anxiety and depression.  ?Endocrine: Denied endocrine symptoms including hot flashes and night sweats.  ? ?Meds: ?  ?Current Outpatient Medications on File Prior to Visit  ?Medication Sig Dispense Refill  ? cetirizine (ZYRTEC) 10 MG tablet Take 10 mg by mouth daily.    ? ibuprofen (ADVIL) 600 MG tablet Take 1 tablet (600 mg total) by mouth every 6 (six) hours as needed. 60 tablet 3  ? ?No current facility-administered medications on file prior to visit.  ? ? ? ? ?Objective:  ?  ? ?Vitals:  ? 01/14/22 0839  ?BP: (!) 146/90  ?Pulse: 81  ? ?Filed Weights  ? 01/14/22 0839  ?Weight: 265 lb 12.8 oz (120.6 kg)  ? ?  ?          ?Abdomen: Soft.  Midline tenderness of lower abdomen over bladder area.  No masses.  No HSM.  ?Incision/s: Intact.  Healing well.  No erythema.  No drainage.  ? ? ?        ? ?Assessment:  ?  ?G2P1011 ?There are no problems to display for this patient. ? ?  ?1. Postoperative state   ?2. Post-op pain   ?3. Acute cystitis with hematuria   ? ?  Patient has midline lower abdominal pain.  ? UTI. ? ?  She is otherwise doing very well from surgery and her abdominal incisions are healing perfectly. ? ? ?Plan:  ?  ?       ? 1.  Urine for UA today possible C&S.  Will treat appropriately based on results. ? 2.  If no issues with urine consider continued expectant management for several days but if pain continues  expect ultrasound follow-up. ? ?Orders ?Orders Placed This Encounter  ?Procedures  ? POC Urinalysis Dipstick OB  ? ?  ?Meds ordered this encounter  ?Medications  ? nitrofurantoin, macrocrystal-monohydrate, (MACROBID) 100 MG capsule  ?  Sig: Take 1 capsule (100 mg total) by mouth 2 (two) times daily.  ?  Dispense:  14 capsule  ?  Refill:  1  ?  ?  F/U ? No follow-ups on file. ? ?Finis Bud, M.D. ?01/14/2022 ?9:25 AM ? ? ? ? ?

## 2022-01-14 NOTE — Progress Notes (Signed)
Patient presents for 2 week post-op follow-up. She states no bleeding anymore. Patient complains of pelvic pain after she increased her physical activity this weekend, continuing to take ibuprofen as needed. No other concerns at this time. ?

## 2022-01-15 ENCOUNTER — Other Ambulatory Visit: Payer: Self-pay | Admitting: Obstetrics and Gynecology

## 2022-01-15 DIAGNOSIS — N921 Excessive and frequent menstruation with irregular cycle: Secondary | ICD-10-CM

## 2022-01-15 DIAGNOSIS — D219 Benign neoplasm of connective and other soft tissue, unspecified: Secondary | ICD-10-CM

## 2022-01-22 ENCOUNTER — Encounter: Payer: Self-pay | Admitting: Obstetrics and Gynecology

## 2022-02-04 ENCOUNTER — Encounter: Payer: Managed Care, Other (non HMO) | Admitting: Obstetrics and Gynecology

## 2022-02-04 DIAGNOSIS — Z9889 Other specified postprocedural states: Secondary | ICD-10-CM

## 2022-02-12 ENCOUNTER — Ambulatory Visit (INDEPENDENT_AMBULATORY_CARE_PROVIDER_SITE_OTHER): Payer: Managed Care, Other (non HMO) | Admitting: Obstetrics and Gynecology

## 2022-02-12 ENCOUNTER — Encounter: Payer: Self-pay | Admitting: Obstetrics and Gynecology

## 2022-02-12 VITALS — BP 140/90 | HR 84 | Ht 69.0 in | Wt 265.4 lb

## 2022-02-12 DIAGNOSIS — Z9889 Other specified postprocedural states: Secondary | ICD-10-CM

## 2022-02-12 NOTE — Progress Notes (Signed)
Patient presents today for 6 week post surgical follow-up. She states no pain or bleeding and that her bladder infection has resolved. Patient states no other questions or concerns at this time.

## 2022-02-12 NOTE — Progress Notes (Signed)
HPI:      [name] is a 41 y.o. G2P1011 who LMP was No LMP recorded (lmp unknown). (Menstrual status: IUD).  Subjective:   She presents today 6 weeks postop from LAVH for large uterine fibroids.  She reports she is doing very well.  She has no pain.  She has no bleeding.  She did get a postop UTI which she took antibiotics for and she is now completely better from that.  She reports no problems with her abdominal incisions.  She is eating voiding and having bowel movements without difficulty.  She seems very happy with her surgery.    Hx: The following portions of the patient's history were reviewed and updated as appropriate:             She  has a past medical history of History of kidney stones, Pre-diabetes, and Submucous uterine fibroid. She does not have a problem list on file. She  has a past surgical history that includes Ureteroscopy (Left, 07/04/2014) and Laparoscopic vaginal hysterectomy with salpingectomy (Bilateral, 12/22/2021). Her family history includes Alcohol abuse in her father; Breast cancer in her mother; COPD in her father; Hypertension in her father and mother. She  reports that she has never smoked. She has never used smokeless tobacco. She reports that she does not currently use alcohol after a past usage of about 2.0 standard drinks of alcohol per week. She reports that she does not currently use drugs. She has a current medication list which includes the following prescription(s): cetirizine and ibuprofen. She is allergic to percocet [oxycodone-acetaminophen].       Review of Systems:  Review of Systems  Constitutional: Denied constitutional symptoms, night sweats, recent illness, fatigue, fever, insomnia and weight loss.  Eyes: Denied eye symptoms, eye pain, photophobia, vision change and visual disturbance.  Ears/Nose/Throat/Neck: Denied ear, nose, throat or neck symptoms, hearing loss, nasal discharge, sinus congestion and sore throat.  Cardiovascular:  Denied cardiovascular symptoms, arrhythmia, chest pain/pressure, edema, exercise intolerance, orthopnea and palpitations.  Respiratory: Denied pulmonary symptoms, asthma, pleuritic pain, productive sputum, cough, dyspnea and wheezing.  Gastrointestinal: Denied, gastro-esophageal reflux, melena, nausea and vomiting.  Genitourinary: Denied genitourinary symptoms including symptomatic vaginal discharge, pelvic relaxation issues, and urinary complaints.  Musculoskeletal: Denied musculoskeletal symptoms, stiffness, swelling, muscle weakness and myalgia.  Dermatologic: Denied dermatology symptoms, rash and scar.  Neurologic: Denied neurology symptoms, dizziness, headache, neck pain and syncope.  Psychiatric: Denied psychiatric symptoms, anxiety and depression.  Endocrine: Denied endocrine symptoms including hot flashes and night sweats.   Meds:   Current Outpatient Medications on File Prior to Visit  Medication Sig Dispense Refill   cetirizine (ZYRTEC) 10 MG tablet Take 10 mg by mouth daily.     ibuprofen (ADVIL) 600 MG tablet Take 1 tablet (600 mg total) by mouth every 6 (six) hours as needed. 60 tablet 3   No current facility-administered medications on file prior to visit.      Objective:     Vitals:   02/12/22 1400  BP: 140/90  Pulse: 84   Filed Weights   02/12/22 1400  Weight: 265 lb 6.4 oz (120.4 kg)               Abdomen: Soft.  Non-tender.  No masses.  No HSM.  Incision/s: Intact.  Healing well.  No erythema.  No drainage.    Pelvic:   Vulva: Normal appearance.  No lesions.  Vagina: No lesions or abnormalities noted.  Support: Normal pelvic support.  Urethra No  masses tenderness or scarring.  Meatus Normal size without lesions or prolapse  Vag Cuff: Intact.  No lesions.  Anus: Normal exam.  No lesions.  Perineum: Normal exam.  No lesions.        Bimanual   Adnexae: No masses.  Non-tender to palpation.  Cuff: Negative for abnormality.             Assessment:     G2P1011 There are no problems to display for this patient.    1. Postoperative state     Patient with excellent recovery   Plan:            1.  May resume normal activities.  No heavy lifting. Orders No orders of the defined types were placed in this encounter.   No orders of the defined types were placed in this encounter.     F/U  Return in about 3 months (around 05/15/2022).  Finis Bud, M.D. 02/12/2022 2:53 PM

## 2022-05-20 ENCOUNTER — Encounter: Payer: Self-pay | Admitting: Obstetrics and Gynecology

## 2022-05-20 ENCOUNTER — Ambulatory Visit: Payer: Managed Care, Other (non HMO) | Admitting: Obstetrics and Gynecology

## 2022-05-20 VITALS — BP 147/87 | HR 71 | Ht 69.0 in | Wt 266.9 lb

## 2022-05-20 DIAGNOSIS — N898 Other specified noninflammatory disorders of vagina: Secondary | ICD-10-CM

## 2022-05-20 DIAGNOSIS — Z9889 Other specified postprocedural states: Secondary | ICD-10-CM | POA: Diagnosis not present

## 2022-05-20 NOTE — Progress Notes (Signed)
HPI:      [name] is a 41 y.o. G2P1011 who LMP was No LMP recorded (lmp unknown). Patient has had a hysterectomy.  Subjective:   She presents today approximately 3 months from hysterectomy for large uterine fibroids.  She reports she is feeling much better.  Is having no bleeding.  She does complain of some vaginal dryness especially with intercourse.  She has not tried over-the-counter lubrication.  She reports no problems with urination or bowel movements.    Hx: The following portions of the patient's history were reviewed and updated as appropriate:             She  has a past medical history of History of kidney stones, Pre-diabetes, and Submucous uterine fibroid. She does not have a problem list on file. She  has a past surgical history that includes Ureteroscopy (Left, 07/04/2014) and Laparoscopic vaginal hysterectomy with salpingectomy (Bilateral, 12/22/2021). Her family history includes Alcohol abuse in her father; Breast cancer in her mother; COPD in her father; Hypertension in her father and mother. She  reports that she has never smoked. She has never used smokeless tobacco. She reports that she does not currently use alcohol after a past usage of about 2.0 standard drinks of alcohol per week. She reports that she does not currently use drugs. She has a current medication list which includes the following prescription(s): cetirizine and ibuprofen. She is allergic to percocet [oxycodone-acetaminophen].       Review of Systems:  Review of Systems  Constitutional: Denied constitutional symptoms, night sweats, recent illness, fatigue, fever, insomnia and weight loss.  Eyes: Denied eye symptoms, eye pain, photophobia, vision change and visual disturbance.  Ears/Nose/Throat/Neck: Denied ear, nose, throat or neck symptoms, hearing loss, nasal discharge, sinus congestion and sore throat.  Cardiovascular: Denied cardiovascular symptoms, arrhythmia, chest pain/pressure, edema,  exercise intolerance, orthopnea and palpitations.  Respiratory: Denied pulmonary symptoms, asthma, pleuritic pain, productive sputum, cough, dyspnea and wheezing.  Gastrointestinal: Denied, gastro-esophageal reflux, melena, nausea and vomiting.  Genitourinary: See HPI for additional information.  Musculoskeletal: Denied musculoskeletal symptoms, stiffness, swelling, muscle weakness and myalgia.  Dermatologic: Denied dermatology symptoms, rash and scar.  Neurologic: Denied neurology symptoms, dizziness, headache, neck pain and syncope.  Psychiatric: Denied psychiatric symptoms, anxiety and depression.  Endocrine: Denied endocrine symptoms including hot flashes and night sweats.   Meds:   Current Outpatient Medications on File Prior to Visit  Medication Sig Dispense Refill   cetirizine (ZYRTEC) 10 MG tablet Take 10 mg by mouth daily.     ibuprofen (ADVIL) 600 MG tablet Take 1 tablet (600 mg total) by mouth every 6 (six) hours as needed. 60 tablet 3   No current facility-administered medications on file prior to visit.      Objective:     Vitals:   05/20/22 0952  BP: (!) 147/87  Pulse: 71   Filed Weights   05/20/22 0952  Weight: 266 lb 14.4 oz (121.1 kg)               Abdomen: Soft.  Non-tender.  No masses.  No HSM.  Incision/s: Intact.  Healing well.  No erythema.  No drainage.    Pelvic:   Vulva: Normal appearance.  No lesions.  Vagina: No lesions or abnormalities noted.  Support: Normal pelvic support.  Urethra No masses tenderness or scarring.  Meatus Normal size without lesions or prolapse  Vag Cuff: Intact.  No lesions.  Anus: Normal exam.  No lesions.  Perineum: Normal exam.  No lesions.        Bimanual   Adnexae: No masses.  Non-tender to palpation.  Cuff: Negative for abnormality.             Assessment:    G2P1011 There are no problems to display for this patient.    1. Vaginal dryness   2. Postoperative state     Excellent recovery  postop.   Plan:            1.  We have discussed vaginal dryness.  Increasing foreplay as well as over-the-counter lubrication discussed.  Possible future use of Premarin vaginal cream or Imvexxy discussed. May resume all normal activities. Plan follow-up for annual examination in April. Orders No orders of the defined types were placed in this encounter.   No orders of the defined types were placed in this encounter.     F/U  Return in about 7 months (around 12/19/2022) for Annual Physical. I spent 17 minutes involved in the care of this patient preparing to see the patient by obtaining and reviewing her medical history (including labs, imaging tests and prior procedures), documenting clinical information in the electronic health record (EHR), counseling and coordinating care plans, writing and sending prescriptions, ordering tests or procedures and in direct communicating with the patient and medical staff discussing pertinent items from her history and physical exam.  Finis Bud, M.D. 05/20/2022 10:34 AM

## 2022-05-20 NOTE — Progress Notes (Signed)
Patient presents for 3 month postop follow-up following hysterectomy. She states doing well since surgery other than occasional vaginal dryness. No other concerns at this time.

## 2022-08-21 ENCOUNTER — Encounter: Payer: Managed Care, Other (non HMO) | Admitting: Obstetrics and Gynecology

## 2022-12-01 ENCOUNTER — Encounter: Payer: Self-pay | Admitting: Obstetrics and Gynecology

## 2022-12-01 ENCOUNTER — Ambulatory Visit (INDEPENDENT_AMBULATORY_CARE_PROVIDER_SITE_OTHER): Payer: Managed Care, Other (non HMO) | Admitting: Obstetrics and Gynecology

## 2022-12-01 VITALS — BP 127/86 | HR 76 | Ht 69.0 in | Wt 265.0 lb

## 2022-12-01 DIAGNOSIS — Z01419 Encounter for gynecological examination (general) (routine) without abnormal findings: Secondary | ICD-10-CM | POA: Diagnosis not present

## 2022-12-01 NOTE — Progress Notes (Signed)
HPI:      [name] is a 42 y.o. G2P1011 who LMP was No LMP recorded (lmp unknown). Patient has had a hysterectomy.  Subjective:   She presents today for her annual examination.  She has no complaints.  She has previously had a hysterectomy.  She is up-to-date on Pap smear and blood work. Last year she was having some difficulty with vaginal dryness and she says that she has worked around this and has not been having this issue recently.    Hx: The following portions of the patient's history were reviewed and updated as appropriate:             She  has a past medical history of History of kidney stones, Pre-diabetes, and Submucous uterine fibroid. She does not have a problem list on file. She  has a past surgical history that includes Ureteroscopy (Left, 07/04/2014) and Laparoscopic vaginal hysterectomy with salpingectomy (Bilateral, 12/22/2021). Her family history includes Alcohol abuse in her father; Breast cancer in her mother; COPD in her father; Hypertension in her father and mother. She  reports that she has never smoked. She has never used smokeless tobacco. She reports that she does not currently use alcohol after a past usage of about 2.0 standard drinks of alcohol per week. She reports that she does not currently use drugs. She has a current medication list which includes the following prescription(s): cetirizine and ibuprofen. She is allergic to percocet [oxycodone-acetaminophen].       Review of Systems:  Review of Systems  Constitutional: Denied constitutional symptoms, night sweats, recent illness, fatigue, fever, insomnia and weight loss.  Eyes: Denied eye symptoms, eye pain, photophobia, vision change and visual disturbance.  Ears/Nose/Throat/Neck: Denied ear, nose, throat or neck symptoms, hearing loss, nasal discharge, sinus congestion and sore throat.  Cardiovascular: Denied cardiovascular symptoms, arrhythmia, chest pain/pressure, edema, exercise intolerance,  orthopnea and palpitations.  Respiratory: Denied pulmonary symptoms, asthma, pleuritic pain, productive sputum, cough, dyspnea and wheezing.  Gastrointestinal: Denied, gastro-esophageal reflux, melena, nausea and vomiting.  Genitourinary: Denied genitourinary symptoms including symptomatic vaginal discharge, pelvic relaxation issues, and urinary complaints.  Musculoskeletal: Denied musculoskeletal symptoms, stiffness, swelling, muscle weakness and myalgia.  Dermatologic: Denied dermatology symptoms, rash and scar.  Neurologic: Denied neurology symptoms, dizziness, headache, neck pain and syncope.  Psychiatric: Denied psychiatric symptoms, anxiety and depression.  Endocrine: Denied endocrine symptoms including hot flashes and night sweats.   Meds:   Current Outpatient Medications on File Prior to Visit  Medication Sig Dispense Refill   cetirizine (ZYRTEC) 10 MG tablet Take 10 mg by mouth daily.     ibuprofen (ADVIL) 600 MG tablet Take 1 tablet (600 mg total) by mouth every 6 (six) hours as needed. 60 tablet 3   No current facility-administered medications on file prior to visit.     Objective:     Vitals:   12/01/22 0919  BP: 127/86  Pulse: 76    Filed Weights   12/01/22 0919  Weight: 265 lb (120.2 kg)              Patient declines examination as Pap not due and mammogram not due.  Assessment:    G2P1011 There are no problems to display for this patient.    1. Well woman exam with routine gynecological exam        Plan:            1.  Basic Screening Recommendations The basic screening recommendations for asymptomatic women were discussed with the  patient during her visit.  The age-appropriate recommendations were discussed with her and the rational for the tests reviewed.  When I am informed by the patient that another primary care physician has previously obtained the age-appropriate tests and they are up-to-date, only outstanding tests are ordered and referrals  given as necessary.  Abnormal results of tests will be discussed with her when all of her results are completed.  Routine preventative health maintenance measures emphasized: Exercise/Diet/Weight control, Tobacco Warnings, Alcohol/Substance use risks and Stress Management Recommend pelvic exam next year Orders No orders of the defined types were placed in this encounter.   No orders of the defined types were placed in this encounter.         F/U  Return in about 1 year (around 12/01/2023) for Annual Physical.  Finis Bud, M.D. 12/01/2022 9:32 AM

## 2022-12-01 NOTE — Progress Notes (Signed)
Patients presents for annual exam today. She states doing well over the past year. History of hysterectomy.  Up to date with mammogram. Annual labs are ordered. She states no other questions or concerns at this time.

## 2023-05-07 ENCOUNTER — Ambulatory Visit: Payer: Managed Care, Other (non HMO) | Admitting: Certified Nurse Midwife

## 2023-05-07 ENCOUNTER — Other Ambulatory Visit (HOSPITAL_COMMUNITY)
Admission: RE | Admit: 2023-05-07 | Discharge: 2023-05-07 | Disposition: A | Discharge status: Home / Self Care | Payer: Managed Care, Other (non HMO) | Source: Ambulatory Visit | Attending: Obstetrics and Gynecology | Admitting: Obstetrics and Gynecology

## 2023-05-07 ENCOUNTER — Encounter: Payer: Self-pay | Admitting: Certified Nurse Midwife

## 2023-05-07 VITALS — BP 162/84 | HR 88 | Ht 69.0 in | Wt 266.0 lb

## 2023-05-07 DIAGNOSIS — N949 Unspecified condition associated with female genital organs and menstrual cycle: Secondary | ICD-10-CM | POA: Insufficient documentation

## 2023-05-07 DIAGNOSIS — Z113 Encounter for screening for infections with a predominantly sexual mode of transmission: Secondary | ICD-10-CM | POA: Insufficient documentation

## 2023-05-07 DIAGNOSIS — N898 Other specified noninflammatory disorders of vagina: Secondary | ICD-10-CM | POA: Diagnosis not present

## 2023-05-07 NOTE — Progress Notes (Unsigned)
Nathaneil Canary, PA-C   Chief Complaint  Patient presents with   vaginal sore    HPI:      Jill Mcmillan is a 42 y.o. G2P1011 whose LMP was No LMP recorded (lmp unknown). Patient has had a hysterectomy., presents today for vaginal sore with bleeding. Pain & swelling began 2-3d ago, applied hot compresses with minimal relief. Bleeding began yesterday, has had hysterectomy so this was concerning to her. Pain however has improved as well as swelling. Denies new sexual contacts, monogamous with female partner.    There are no problems to display for this patient.   Past Surgical History:  Procedure Laterality Date   LAPAROSCOPIC VAGINAL HYSTERECTOMY WITH SALPINGECTOMY Bilateral 12/22/2021   Procedure: LAPAROSCOPIC ASSISTED VAGINAL HYSTERECTOMY WITH SALPINGECTOMY AND INTRAUTERINE DEVICE REMOVAL;  Surgeon: Linzie Collin, MD;  Location: [facility] ORS;  Service: Gynecology;  Laterality: Bilateral;   URETEROSCOPY Left 07/04/2014   stent placement    Family History  Problem Relation Age of Onset   Breast cancer Mother    Hypertension Mother    Hypertension Father    COPD Father    Alcohol abuse Father     Social History   Socioeconomic History   Marital status: Married    Spouse name: Gardiner Barefoot   Number of children: 1   Years of education: Not on file   Highest education level: Not on file  Occupational History   Not on file  Tobacco Use   Smoking status: Never   Smokeless tobacco: Never  Vaping Use   Vaping status: Never Used  Substance and Sexual Activity   Alcohol use: Not Currently    Alcohol/week: 2.0 standard drinks of alcohol    Types: 2 Glasses of wine per week    Comment: occasionally   Drug use: Not Currently   Sexual activity: Yes    Birth control/protection: Surgical    Comment: hysterectomy  Other Topics Concern   Not on file  Social History Narrative   Not on file   Social Determinants of Health   Financial Resource Strain: Low Risk  (12/15/2022)    Received from [facility]   Overall Financial Resource Strain (CARDIA)    Difficulty of Paying Living Expenses: Not hard at all  Food Insecurity: No Food Insecurity (12/15/2022)   Received from [facility]   Hunger Vital Sign    Worried About Running Out of Food in the Last Year: Never true    Ran Out of Food in the Last Year: Never true  Transportation Needs: No Transportation Needs (12/15/2022)   Received from [facility] - Transportation    Lack of Transportation (Medical): No    Lack of Transportation (Non-Medical): No  Physical Activity: Unknown (12/15/2022)   Received from [facility]   Exercise Vital Sign    Days of Exercise per Week: 0 days    Minutes of Exercise per Session: Not on file  Stress: No Stress Concern Present (12/15/2022)   Received from [facility] - Occupational Stress Questionnaire    Feeling of Stress : Not at all  Social Connections: Socially Integrated (12/15/2022)   Received from [facility]   Social Network    How would you rate your social network (family, work, friends)?: Good participation with social networks  Intimate Partner Violence: Not At Risk (12/15/2022)   Received from [facility]   HITS    Over the last 12 months how often  did your partner physically hurt you?: 1    Over the last 12 months how often did your partner insult you or talk down to you?: 1    Over the last 12 months how often did your partner threaten you with physical harm?: 1    Over the last 12 months how often did your partner scream or curse at you?: 1    Outpatient Medications Prior to Visit  Medication Sig Dispense Refill   cetirizine (ZYRTEC) 10 MG tablet Take 10 mg by mouth daily.     ibuprofen (ADVIL) 600 MG tablet Take 1 tablet (600 mg total) by mouth every 6 (six) hours as needed. 60 tablet 3   No facility-administered medications prior to visit.      ROS:  Review of Systems  Constitutional:  Negative.   Respiratory: Negative.    Cardiovascular: Negative.   Genitourinary:  Positive for vaginal bleeding and vaginal pain.     OBJECTIVE:   Vitals:  BP (!) 162/84   Pulse 88   Ht 5\' 9"  (1.753 m)   Wt 266 lb (120.7 kg)   LMP  (LMP Unknown)   BMI 39.28 kg/m   Physical Exam Vitals reviewed.  Constitutional:      Appearance: Normal appearance.  Cardiovascular:     Rate and Rhythm: Normal rate.  Pulmonary:     Effort: Pulmonary effort is normal.  Genitourinary:      Comments: NEFG, lesion noted to right lower inner labia just outside introitus, without erythema, unable to express discharge, no bleeding. Speculum exam reveals pink well rugated vagina cuff visualized, physiologic discharge present. Neurological:     Mental Status: She is alert.     Results: No results found for this or any previous visit (from the past 24 hour(s)).   Assessment/Plan: Vaginal discomfort - Plan: Cervicovaginal ancillary only, Herpes simplex virus culture  Screen for sexually transmitted diseases - Plan: Cervicovaginal ancillary only, Herpes simplex virus culture  Likely resolving sebaceous cyst. Cultures collected. Comfort measures & warning signs of infection reviewed.  No orders of the defined types were placed in this encounter.    Dominica Severin, CNM 05/07/2023 5:03 PM

## 2023-05-10 LAB — HERPES SIMPLEX VIRUS CULTURE

## 2023-05-11 LAB — CERVICOVAGINAL ANCILLARY ONLY
Chlamydia: NEGATIVE
Comment: NEGATIVE
Comment: NORMAL
Neisseria Gonorrhea: NEGATIVE

## 2023-05-26 ENCOUNTER — Ambulatory Visit: Payer: Managed Care, Other (non HMO) | Admitting: Obstetrics and Gynecology

## 2024-03-30 ENCOUNTER — Encounter: Payer: Self-pay | Admitting: Obstetrics and Gynecology

## 2024-03-30 ENCOUNTER — Ambulatory Visit: Admitting: Obstetrics and Gynecology

## 2024-03-30 VITALS — BP 133/85 | HR 81 | Ht 69.0 in | Wt 271.8 lb

## 2024-03-30 DIAGNOSIS — Z01411 Encounter for gynecological examination (general) (routine) with abnormal findings: Secondary | ICD-10-CM

## 2024-03-30 DIAGNOSIS — Z01419 Encounter for gynecological examination (general) (routine) without abnormal findings: Secondary | ICD-10-CM

## 2024-03-30 DIAGNOSIS — Z90710 Acquired absence of both cervix and uterus: Secondary | ICD-10-CM

## 2024-03-30 DIAGNOSIS — Z1231 Encounter for screening mammogram for malignant neoplasm of breast: Secondary | ICD-10-CM

## 2024-03-30 NOTE — Progress Notes (Signed)
 Patients presents for annual exam today. She states noticing increased urinary frequency, denies UTI symptoms and recent UA was negative and culture has not resulted yet. History of hysterectomy. Up to date on mammogram. Patients annual labs are completed with PCP. She states no other questions or concerns at this time.

## 2024-03-30 NOTE — Progress Notes (Signed)
 HPI:      [name] is a 43 y.o. G2P1011 who LMP was No LMP recorded (lmp unknown). Patient has had a hysterectomy.  Subjective:   She presents today for her well woman visit.  She states that she is generally doing well.  She is trying to be more healthy.  She is drinking more water toward this end.  She does state that she has noticed some increased urgency and feeling like she has to urinate more frequently.  She is not sure if this is secondary to her new blood pressure medication or her increased fluid consumption.    Hx: The following portions of the patient's history were reviewed and updated as appropriate:             She  has a past medical history of History of kidney stones, Pre-diabetes, and Submucous uterine fibroid. She does not have a problem list on file. She  has a past surgical history that includes Ureteroscopy (Left, 07/04/2014) and Laparoscopic vaginal hysterectomy with salpingectomy (Bilateral, 12/22/2021). Her family history includes Alcohol abuse in her father; Breast cancer in her mother; COPD in her father; Hypertension in her father and mother. She  reports that she has never smoked. She has never used smokeless tobacco. She reports that she does not currently use alcohol after a past usage of about 2.0 standard drinks of alcohol per week. She reports that she does not currently use drugs. She has a current medication list which includes the following prescription(s): albuterol, amlodipine, atorvastatin, cetirizine, and ibuprofen . She is allergic to percocet [oxycodone -acetaminophen ].       Review of Systems:  Review of Systems  Constitutional: Denied constitutional symptoms, night sweats, recent illness, fatigue, fever, insomnia and weight loss.  Eyes: Denied eye symptoms, eye pain, photophobia, vision change and visual disturbance.  Ears/Nose/Throat/Neck: Denied ear, nose, throat or neck symptoms, hearing loss, nasal discharge, sinus congestion and sore  throat.  Cardiovascular: Denied cardiovascular symptoms, arrhythmia, chest pain/pressure, edema, exercise intolerance, orthopnea and palpitations.  Respiratory: Denied pulmonary symptoms, asthma, pleuritic pain, productive sputum, cough, dyspnea and wheezing.  Gastrointestinal: Denied, gastro-esophageal reflux, melena, nausea and vomiting.  Genitourinary: See HPI for additional information.  Musculoskeletal: Denied musculoskeletal symptoms, stiffness, swelling, muscle weakness and myalgia.  Dermatologic: Denied dermatology symptoms, rash and scar.  Neurologic: Denied neurology symptoms, dizziness, headache, neck pain and syncope.  Psychiatric: Denied psychiatric symptoms, anxiety and depression.  Endocrine: Denied endocrine symptoms including hot flashes and night sweats.   Meds:   Current Outpatient Medications on File Prior to Visit  Medication Sig Dispense Refill   albuterol (VENTOLIN HFA) 108 (90 Base) MCG/ACT inhaler Inhale 2 puffs into the lungs.     amLODipine (NORVASC) 5 MG tablet Take 5 mg by mouth daily.     atorvastatin (LIPITOR) 20 MG tablet Take 20 mg by mouth daily.     cetirizine (ZYRTEC) 10 MG tablet Take 10 mg by mouth daily.     ibuprofen  (ADVIL ) 600 MG tablet Take 1 tablet (600 mg total) by mouth every 6 (six) hours as needed. 60 tablet 3   No current facility-administered medications on file prior to visit.     Objective:     Vitals:   03/30/24 0811  BP: 133/85  Pulse: 81    Filed Weights   03/30/24 0811  Weight: 271 lb 12.8 oz (123.3 kg)              Physical examination General NAD, Conversant  HEENT Atraumatic; Op  clear with mmm.  Normo-cephalic.  Anicteric sclerae  Thyroid/Neck Smooth without nodularity or enlargement. Normal ROM.  Neck Supple.  Skin No rashes, lesions or ulceration. Normal palpated skin turgor. No nodularity.  Breasts: Deferred by patient  Lungs: Clear to auscultation.No rales or wheezes. Normal Respiratory effort, no retractions.   Heart: NSR.  No murmurs or rubs appreciated. No peripheral edema  Abdomen: Soft.  Non-tender.  No masses.  No HSM. No hernia  Extremities: Moves all appropriately.  Normal ROM for age. No lymphadenopathy.  Neuro: Oriented to PPT.  Normal mood. Normal affect.     Pelvic:   Vulva: Normal appearance.  No lesions.  Vagina: No lesions or abnormalities noted.  Support: Normal pelvic support.  Urethra No masses tenderness or scarring.  Meatus Normal size without lesions or prolapse.  Cervix: Surgically absent  Anus: Normal exam.  No lesions.  Perineum: Normal exam.  No lesions.        Bimanual   Uterus: Surgically absent  Adnexae: No masses.  Non-tender to palpation.  Cul-de-sac: Negative for abnormality.     Assessment:    G2P1011 There are no active problems to display for this patient.    1. Well woman exam with routine gynecological exam   2. Screening mammogram for breast cancer     We have discussed her increased urinary frequency.  She recently had a UA which was negative (cultures pending).  Possibly her increased water consumption.  We have discussed stopping her fluid intake earlier in the evening as most of her frequency occurs at night.  We have discussed the possibility of anticholinergics for urgency.  She is not interested at this time. No significant pelvic relaxation on exam today.   Plan:            1.  Basic Screening Recommendations The basic screening recommendations for asymptomatic women were discussed with the patient during her visit.  The age-appropriate recommendations were discussed with her and the rational for the tests reviewed.  When I am informed by the patient that another primary care physician has previously obtained the age-appropriate tests and they are up-to-date, only outstanding tests are ordered and referrals given as necessary.  Abnormal results of tests will be discussed with her when all of her results are completed.  Routine preventative  health maintenance measures emphasized: Exercise/Diet/Weight control, Tobacco Warnings, Alcohol/Substance use risks and Stress Management 2.  To contact us  if she would like to consider medications for urinary urgency. Orders Orders Placed This Encounter  Procedures   MM DIGITAL SCREENING BILATERAL    No orders of the defined types were placed in this encounter.         F/U  Return in about 1 year (around 03/30/2025) for Annual Physical.  Alm DOROTHA Sar, M.D. 03/30/2024 8:45 AM
